# Patient Record
Sex: Female | Born: 1980 | Race: Black or African American | Hispanic: No | Marital: Single | Smoking: Former smoker
Health system: Southern US, Community
[De-identification: ages and names within clinical notes are randomized; demographics above are authoritative.]

## PROBLEM LIST (undated history)

## (undated) ENCOUNTER — Inpatient Hospital Stay (HOSPITAL_COMMUNITY): Payer: Self-pay

## (undated) ENCOUNTER — Emergency Department: Discharge status: Absent without leave | Payer: Medicaid Other

## (undated) DIAGNOSIS — I10 Essential (primary) hypertension: Secondary | ICD-10-CM

## (undated) DIAGNOSIS — B009 Herpesviral infection, unspecified: Secondary | ICD-10-CM

## (undated) HISTORY — DX: Herpesviral infection, unspecified: B00.9

---
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
## 1998-01-07 ENCOUNTER — Emergency Department (HOSPITAL_COMMUNITY): Admission: EM | Admit: 1998-01-07 | Discharge: 1998-01-08 | Payer: Self-pay | Admitting: Emergency Medicine

## 1998-04-19 ENCOUNTER — Encounter: Payer: Self-pay | Admitting: Emergency Medicine

## 1998-04-19 ENCOUNTER — Emergency Department (HOSPITAL_COMMUNITY): Admission: EM | Admit: 1998-04-19 | Discharge: 1998-04-19 | Payer: Self-pay | Admitting: Emergency Medicine

## 1998-09-22 ENCOUNTER — Encounter: Payer: Self-pay | Admitting: Emergency Medicine

## 1998-09-22 ENCOUNTER — Emergency Department (HOSPITAL_COMMUNITY): Admission: EM | Admit: 1998-09-22 | Discharge: 1998-09-22 | Payer: Self-pay | Admitting: *Deleted

## 1998-12-18 ENCOUNTER — Encounter: Payer: Self-pay | Admitting: Emergency Medicine

## 1998-12-18 ENCOUNTER — Emergency Department (HOSPITAL_COMMUNITY): Admission: EM | Admit: 1998-12-18 | Discharge: 1998-12-18 | Payer: Self-pay | Admitting: Emergency Medicine

## 1999-06-03 ENCOUNTER — Emergency Department (HOSPITAL_COMMUNITY): Admission: EM | Admit: 1999-06-03 | Discharge: 1999-06-03 | Payer: Self-pay | Admitting: Emergency Medicine

## 1999-09-24 ENCOUNTER — Ambulatory Visit (HOSPITAL_BASED_OUTPATIENT_CLINIC_OR_DEPARTMENT_OTHER): Admission: RE | Admit: 1999-09-24 | Discharge: 1999-09-24 | Payer: Self-pay | Admitting: Orthopedic Surgery

## 1999-09-24 ENCOUNTER — Encounter (INDEPENDENT_AMBULATORY_CARE_PROVIDER_SITE_OTHER): Payer: Self-pay | Admitting: *Deleted

## 1999-10-17 ENCOUNTER — Other Ambulatory Visit: Admission: RE | Admit: 1999-10-17 | Discharge: 1999-10-17 | Payer: Self-pay | Admitting: Obstetrics

## 2000-03-11 ENCOUNTER — Inpatient Hospital Stay (HOSPITAL_COMMUNITY): Admission: AD | Admit: 2000-03-11 | Discharge: 2000-03-11 | Payer: Self-pay | Admitting: Obstetrics

## 2000-03-29 ENCOUNTER — Inpatient Hospital Stay (HOSPITAL_COMMUNITY): Admission: AD | Admit: 2000-03-29 | Discharge: 2000-03-29 | Payer: Self-pay | Admitting: Obstetrics

## 2000-05-01 ENCOUNTER — Inpatient Hospital Stay (HOSPITAL_COMMUNITY): Admission: AD | Admit: 2000-05-01 | Discharge: 2000-05-01 | Payer: Self-pay | Admitting: Obstetrics

## 2000-05-29 ENCOUNTER — Inpatient Hospital Stay (HOSPITAL_COMMUNITY): Admission: AD | Admit: 2000-05-29 | Discharge: 2000-05-29 | Payer: Self-pay | Admitting: Internal Medicine

## 2000-05-29 ENCOUNTER — Inpatient Hospital Stay (HOSPITAL_COMMUNITY): Admission: AD | Admit: 2000-05-29 | Discharge: 2000-05-29 | Payer: Self-pay | Admitting: Obstetrics

## 2000-05-30 ENCOUNTER — Inpatient Hospital Stay (HOSPITAL_COMMUNITY): Admission: AD | Admit: 2000-05-30 | Discharge: 2000-06-01 | Payer: Self-pay | Admitting: Obstetrics

## 2000-09-29 ENCOUNTER — Emergency Department (HOSPITAL_COMMUNITY): Admission: EM | Admit: 2000-09-29 | Discharge: 2000-09-29 | Payer: Self-pay | Admitting: Emergency Medicine

## 2000-09-29 ENCOUNTER — Encounter: Payer: Self-pay | Admitting: Emergency Medicine

## 2001-01-28 ENCOUNTER — Emergency Department (HOSPITAL_COMMUNITY): Admission: EM | Admit: 2001-01-28 | Discharge: 2001-01-28 | Payer: Self-pay | Admitting: Emergency Medicine

## 2004-01-18 ENCOUNTER — Emergency Department (HOSPITAL_COMMUNITY): Admission: EM | Admit: 2004-01-18 | Discharge: 2004-01-18 | Payer: Self-pay

## 2005-01-23 ENCOUNTER — Emergency Department (HOSPITAL_COMMUNITY): Admission: EM | Admit: 2005-01-23 | Discharge: 2005-01-23 | Payer: Self-pay | Admitting: Emergency Medicine

## 2005-07-02 ENCOUNTER — Emergency Department (HOSPITAL_COMMUNITY): Admission: EM | Admit: 2005-07-02 | Discharge: 2005-07-02 | Payer: Self-pay | Admitting: Emergency Medicine

## 2007-01-08 ENCOUNTER — Emergency Department (HOSPITAL_COMMUNITY): Admission: EM | Admit: 2007-01-08 | Discharge: 2007-01-08 | Payer: Self-pay | Admitting: Emergency Medicine

## 2007-12-08 ENCOUNTER — Emergency Department (HOSPITAL_COMMUNITY): Admission: EM | Admit: 2007-12-08 | Discharge: 2007-12-08 | Payer: Self-pay | Admitting: Emergency Medicine

## 2008-10-02 ENCOUNTER — Emergency Department (HOSPITAL_COMMUNITY): Admission: EM | Admit: 2008-10-02 | Discharge: 2008-10-02 | Payer: Self-pay | Admitting: Emergency Medicine

## 2008-11-23 ENCOUNTER — Emergency Department (HOSPITAL_COMMUNITY): Admission: EM | Admit: 2008-11-23 | Discharge: 2008-11-24 | Payer: Self-pay | Admitting: Emergency Medicine

## 2009-08-18 ENCOUNTER — Emergency Department (HOSPITAL_COMMUNITY): Admission: EM | Admit: 2009-08-18 | Discharge: 2009-08-18 | Payer: Self-pay | Admitting: Emergency Medicine

## 2009-10-10 ENCOUNTER — Emergency Department (HOSPITAL_COMMUNITY): Admission: EM | Admit: 2009-10-10 | Discharge: 2009-10-10 | Payer: Self-pay | Admitting: Emergency Medicine

## 2010-04-11 ENCOUNTER — Inpatient Hospital Stay (HOSPITAL_COMMUNITY)
Admission: AD | Admit: 2010-04-11 | Discharge: 2010-04-11 | Payer: Self-pay | Source: Home / Self Care | Attending: Obstetrics & Gynecology | Admitting: Obstetrics & Gynecology

## 2010-04-11 LAB — CBC
HCT: 32.9 % — ABNORMAL LOW (ref 36.0–46.0)
Hemoglobin: 11.1 g/dL — ABNORMAL LOW (ref 12.0–15.0)
MCH: 29.8 pg (ref 26.0–34.0)
MCHC: 33.7 g/dL (ref 30.0–36.0)
MCV: 88.4 fL (ref 78.0–100.0)
Platelets: 234 10*3/uL (ref 150–400)
RBC: 3.72 MIL/uL — ABNORMAL LOW (ref 3.87–5.11)
RDW: 12.4 % (ref 11.5–15.5)
WBC: 14.1 10*3/uL — ABNORMAL HIGH (ref 4.0–10.5)

## 2010-04-11 LAB — URINALYSIS, ROUTINE W REFLEX MICROSCOPIC
Bilirubin Urine: NEGATIVE
Ketones, ur: NEGATIVE mg/dL
Nitrite: POSITIVE — AB
Protein, ur: 30 mg/dL — AB
Specific Gravity, Urine: 1.015 (ref 1.005–1.030)
Urine Glucose, Fasting: NEGATIVE mg/dL
Urobilinogen, UA: 1 mg/dL (ref 0.0–1.0)
pH: 6 (ref 5.0–8.0)

## 2010-04-11 LAB — WET PREP, GENITAL: Yeast Wet Prep HPF POC: NONE SEEN

## 2010-04-11 LAB — POCT PREGNANCY, URINE: Preg Test, Ur: NEGATIVE

## 2010-04-11 LAB — URINE MICROSCOPIC-ADD ON

## 2010-04-12 LAB — GC/CHLAMYDIA PROBE AMP, GENITAL
Chlamydia, DNA Probe: NEGATIVE
GC Probe Amp, Genital: NEGATIVE

## 2010-08-23 NOTE — Op Note (Signed)
[place]  Patient:    LENORA, GOMES                     MRN: [id number] Proc. Date: 09/24/99 Adm. Date:  [id number] Disc. Date: [id number] Attending:  Susa Day CC:         Katy Fitch. Sypher, Montez Hageman., M.D. 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  OPERATION PERFORMED:  Excision of right dorsal ganglion deep to extensor digititorum brevis manus muscle.  SURGEON:  Katy Fitch. Naaman Plummer., M.D.  ANESTHESIA:  High forearm level IV regional.  SUPERVISING ANESTHESIOLOGIST:  Dr. Krista Blue.  INDICATIONS FOR PROCEDURE:  Ms. Creedon is a 30 year old woman who presented for evaluation of mass on the dorsal aspect of her right wrist.  This was causing pain with wrist dorsiflexion and palmar flexion.  She had a history of prominent soft tissue masses on the dorsal aspect of both wrists.  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  However, on the right she also had a secondary mass consistent with a subretinacular ganglion.  After informed consent she was brought to the operating room at this time for excision of her ganglion.  DESCRIPTION OF PROCEDURE:  Shauntay Brunelli was brought to the operating room and placed in supine position on the operating table.  Following placement of a high forearm IV regional block, the right arm was prepped with Betadine soap and solution and sterilely draped.  When anesthesia was satisfactory, the procedure commenced with a short transverse incision.  The subcutaneous tissues were carefully divided taking care to identify the extensor retinaculum.  This was split to its distal fibers revealing a  large extensor digitorum brevis manus muscle.  This was retracted in an ulnar direction revealing a ganglion measuring 2 cm in width and 1.5 cm in length.  This was circumferentially dissected with scissors dissection and bipolar cautery to the capsule.  The stalk was removed directly from the scapholunate ligament.  A small opening in the dorsal capsule was made measuring 6 x 3 mm exposing the scapholunate ligament. The ligament appeared to be otherwise normal.  Bleeding points were electrocauterized with bipolar current followed by repair of the wound with intradermal 3-0 Prolene suture.  A compressive dressing was applied with a volar plaster splint maintaining the wrist in 5 degrees dorsiflexion. There were no apparent complications. DD:  09/24/99 TD:  09/26/99 Job: [id number]

## 2010-11-12 LAB — RUBELLA ANTIBODY, IGM: Rubella: IMMUNE

## 2010-11-12 LAB — ABO/RH: RH Type: POSITIVE

## 2010-11-12 LAB — HEPATITIS B SURFACE ANTIGEN: Hepatitis B Surface Ag: NEGATIVE

## 2010-11-12 LAB — GC/CHLAMYDIA PROBE AMP, GENITAL: Chlamydia: NEGATIVE

## 2010-11-12 LAB — RPR: RPR: NONREACTIVE

## 2010-12-07 HISTORY — PX: TOOTH EXTRACTION: SUR596

## 2010-12-19 ENCOUNTER — Inpatient Hospital Stay (HOSPITAL_COMMUNITY)
Admission: AD | Admit: 2010-12-19 | Discharge: 2010-12-19 | Disposition: A | Discharge status: Home / Self Care | Payer: Medicaid Other | Source: Ambulatory Visit | Attending: Obstetrics | Admitting: Obstetrics

## 2010-12-19 ENCOUNTER — Encounter (HOSPITAL_COMMUNITY): Payer: Self-pay | Admitting: *Deleted

## 2010-12-19 DIAGNOSIS — K029 Dental caries, unspecified: Secondary | ICD-10-CM

## 2010-12-19 DIAGNOSIS — K089 Disorder of teeth and supporting structures, unspecified: Secondary | ICD-10-CM | POA: Insufficient documentation

## 2010-12-19 DIAGNOSIS — O99891 Other specified diseases and conditions complicating pregnancy: Secondary | ICD-10-CM | POA: Insufficient documentation

## 2010-12-19 MED ORDER — AMOXICILLIN 250 MG PO CAPS: 500.0000 mg | ORAL_CAPSULE | Freq: Three times a day (TID) | ORAL | Status: AC

## 2010-12-19 MED ORDER — HYDROCODONE-ACETAMINOPHEN 5-325 MG PO TABS: 2.0000 | ORAL_TABLET | ORAL | Status: AC | PRN

## 2010-12-19 NOTE — Progress Notes (Signed)
Pt reports toothache today.  Pt G2 P1 at 15.1wks.

## 2010-12-19 NOTE — ED Provider Notes (Signed)
History     CSN: [id number] Arrival date & time: 12/19/2010  9:25 PM   Chief Complaint  Patient presents with  . Dental Pain     (Include location/radiation/quality/duration/timing/severity/associated sxs/prior treatment) HPIMonique O Kelley is a 30 y.o. female @ [redacted] weeks gestation who presents to MAU with a tooth ache. She was given tylenol #3 by Dr. Gaynell Face earlier today but it is not helping. Pain in upper right molar. The history was provided by the patient.  No past medical history on file.   No past surgical history on file.  No family history on file.  History  Substance Use Topics  . Smoking status: Not on file  . Smokeless tobacco: Not on file  . Alcohol Use: Not on file    OB History    Grav Para Term Preterm Abortions TAB SAB Ect Mult Living   1               Review of Systems  HENT: Positive for dental problem.   Genitourinary:       Pregnant.  All other systems reviewed and are negative.    Allergies  Review of patient's allergies indicates not on file.  Home Medications  No current outpatient prescriptions on file.  Physical Exam    There were no vitals taken for this visit.  Physical Exam  Nursing note and vitals reviewed. Constitutional: She is oriented to person, place, and time. She appears well-developed and well-nourished. No distress.  HENT:  Head: Normocephalic.  Mouth/Throat: Uvula is midline, oropharynx is clear and moist and mucous membranes are normal. Dental caries present.       Right upper second molar tender.  Eyes: EOM are normal.  Neck: Neck supple.  Pulmonary/Chest: Effort normal.  Abdominal:       Gravid at [redacted] weeks gestation.  Musculoskeletal: Normal range of motion.  Lymphadenopathy:    She has cervical adenopathy.  Neurological: She is alert and oriented to person, place, and time. No cranial nerve deficit.  Skin: Skin is warm and dry.    ED Course  Procedures  Results for orders placed during the hospital  encounter of 04/11/10  POCT PREGNANCY, URINE      Component Value Range   Preg Test, Ur       Value: NEGATIVE            THE SENSITIVITY OF THIS     METHODOLOGY IS >24 mIU/mL  URINALYSIS, ROUTINE W REFLEX MICROSCOPIC      Component Value Range   Color, Urine STRAW (*) YELLOW    Appearance HAZY (*) CLEAR    Specific Gravity, Urine 1.015  1.005 - 1.030    pH 6.0  5.0 - 8.0    Urine Glucose, Fasting NEGATIVE  NEGATIVE (mg/dL)   Hemoglobin, Urine SMALL (*) NEGATIVE    Bilirubin Urine NEGATIVE  NEGATIVE    Ketones, ur NEGATIVE  NEGATIVE (mg/dL)   Protein, ur 30 (*) NEGATIVE (mg/dL)   Urobilinogen, UA 1.0  0.0 - 1.0 (mg/dL)   Nitrite POSITIVE (*) NEGATIVE    Leukocytes, UA SMALL (*) NEGATIVE   URINE MICROSCOPIC-ADD ON      Component Value Range   Squamous Epithelial / LPF RARE  RARE    WBC, UA 11-20  <3 (WBC/hpf)   RBC / HPF 3-6  <3 (RBC/hpf)   Bacteria, UA FEW (*) RARE    Urine-Other TRICHOMONAS PRESENT    WET PREP, GENITAL      Component Value Range  Yeast, Wet Prep NONE SEEN  NONE SEEN    Trich, Wet Prep FEW (*) NONE SEEN    Clue Cells, Wet Prep FEW (*) NONE SEEN    WBC, Wet Prep HPF POC FEW MODERATE BACTERIA SEEN (*) NONE SEEN   CBC      Component Value Range   WBC 14.1 (*) 4.0 - 10.5 (K/uL)   RBC 3.72 (*) 3.87 - 5.11 (MIL/uL)   Hemoglobin 11.1 (*) 12.0 - 15.0 (g/dL)   HCT 04.5 (*) 40.9 - 46.0 (%)   MCV 88.4  78.0 - 100.0 (fL)   MCH 29.8  26.0 - 34.0 (pg)   MCHC 33.7  30.0 - 36.0 (g/dL)   RDW 81.1  91.4 - 78.2 (%)   Platelets 234  150 - 400 (K/uL)  GC/CHLAMYDIA PROBE AMP, GENITAL      Component Value Range   GC Probe Amp, Genital    NEGATIVE    Value: NEGATIVE     (NOTE)  Testing performed using the BD ProbeTec Qx Chlamydia trachomatis and Neisseria gonorrhea amplified DNA assay.  Performed at:  [facility]               [address]                    [place], [place] [address]               [id number]    Chlamydia, DNA Probe    NEGATIVE    Value: NEGATIVE     (NOTE)  Testing performed using the BD ProbeT[employer] Qx Chlamydia trachomatis and Neisseria gonorrhea amplified DNA assay.  Performed at:  [facility]               [address]                    [place], [place] [address]               [id number]   Assessment:   Dental Pain  Plan:   Amoxicillin 500 mg. Po tid x 7 days    Hydrocodone 5/325 mg. Po q 4 - 6 hours prn    Follow up with a dentist as soon as possible.   Wann, NP 12/19/10 2200

## 2011-01-28 ENCOUNTER — Other Ambulatory Visit (HOSPITAL_COMMUNITY): Payer: Self-pay | Admitting: Obstetrics

## 2011-01-28 DIAGNOSIS — Z3689 Encounter for other specified antenatal screening: Secondary | ICD-10-CM

## 2011-01-31 ENCOUNTER — Ambulatory Visit (HOSPITAL_COMMUNITY)
Admission: RE | Admit: 2011-01-31 | Discharge: 2011-01-31 | Disposition: A | Discharge status: Home / Self Care | Payer: Medicaid Other | Source: Ambulatory Visit | Attending: Obstetrics | Admitting: Obstetrics

## 2011-01-31 DIAGNOSIS — Z3689 Encounter for other specified antenatal screening: Secondary | ICD-10-CM

## 2011-01-31 DIAGNOSIS — Z1389 Encounter for screening for other disorder: Secondary | ICD-10-CM | POA: Insufficient documentation

## 2011-01-31 DIAGNOSIS — O358XX0 Maternal care for other (suspected) fetal abnormality and damage, not applicable or unspecified: Secondary | ICD-10-CM | POA: Insufficient documentation

## 2011-01-31 DIAGNOSIS — Z363 Encounter for antenatal screening for malformations: Secondary | ICD-10-CM | POA: Insufficient documentation

## 2011-02-24 ENCOUNTER — Encounter (HOSPITAL_COMMUNITY): Payer: Self-pay | Admitting: *Deleted

## 2011-02-24 ENCOUNTER — Inpatient Hospital Stay (HOSPITAL_COMMUNITY)
Admission: AD | Admit: 2011-02-24 | Discharge: 2011-02-24 | Disposition: A | Discharge status: Home / Self Care | Payer: Medicaid Other | Source: Ambulatory Visit | Attending: Obstetrics | Admitting: Obstetrics

## 2011-02-24 DIAGNOSIS — R109 Unspecified abdominal pain: Secondary | ICD-10-CM | POA: Insufficient documentation

## 2011-02-24 DIAGNOSIS — O99891 Other specified diseases and conditions complicating pregnancy: Secondary | ICD-10-CM | POA: Insufficient documentation

## 2011-02-24 DIAGNOSIS — N949 Unspecified condition associated with female genital organs and menstrual cycle: Secondary | ICD-10-CM

## 2011-02-24 LAB — URINALYSIS, ROUTINE W REFLEX MICROSCOPIC
Bilirubin Urine: NEGATIVE
Glucose, UA: NEGATIVE mg/dL
Hgb urine dipstick: NEGATIVE
Ketones, ur: NEGATIVE mg/dL
Leukocytes, UA: NEGATIVE
Nitrite: NEGATIVE
Protein, ur: NEGATIVE mg/dL
Specific Gravity, Urine: 1.02 (ref 1.005–1.030)
Urobilinogen, UA: 0.2 mg/dL (ref 0.0–1.0)
pH: 6.5 (ref 5.0–8.0)

## 2011-02-24 LAB — WET PREP, GENITAL
Trich, Wet Prep: NONE SEEN
Yeast Wet Prep HPF POC: NONE SEEN

## 2011-02-24 NOTE — Progress Notes (Signed)
Patient states she started having right lower abdominal and right back pain on Saturday. Reports good fetal movement, no bleeding or leaking.

## 2011-02-24 NOTE — ED Provider Notes (Signed)
History     Chief Complaint  Patient presents with  . Abdominal Pain   HPIMonique O Kelley is 30 y.o. G2P1001 [redacted]w[redacted]d weeks presenting with complaints of right sided pain.  Began 2 days ago.  Sharp worse at night, when lying flat.  Denies vaginal bleeding or discharge, or leaking of fluid. Last intercourse 4 days ago.      Past Medical History  Diagnosis Date  . No pertinent past medical history     Past Surgical History  Procedure Date  . No past surgeries   . Tooth extraction 12-2010    No family history on file.  History  Substance Use Topics  . Smoking status: Former Smoker    Quit date: 02/03/2011  . Smokeless tobacco: Never Used  . Alcohol Use: No    Allergies: No Known Allergies  Prescriptions prior to admission  Medication Sig Dispense Refill  . acetaminophen-codeine (TYLENOL #3) 300-30 MG per tablet Take 1 tablet by mouth 3 (three) times daily as needed. Pain        . calcium carbonate (TUMS - DOSED IN MG ELEMENTAL CALCIUM) 500 MG chewable tablet Chew 2 tablets by mouth 2 (two) times daily.        . prenatal vitamin w/FE, FA (PRENATAL 1 + 1) 27-1 MG TABS Take 1 tablet by mouth daily.          Review of Systems  Constitutional: Negative.   HENT: Negative.   Respiratory: Negative.   Cardiovascular: Negative.   Gastrointestinal: Positive for abdominal pain (bilateral lower, lateral-mild).  Genitourinary: Negative.        Negative for vaginal discharge or bleeding.  + fetal movement   Physical Exam   Blood pressure 125/79, pulse 78, temperature 97.8 F (36.6 C), temperature source Oral, resp. rate 16, height 5\' 7"  (1.702 m), weight 159 lb 12.8 oz (72.485 kg), SpO2 99.00%.  Physical Exam  Constitutional: She is oriented to person, place, and time. She appears well-developed and well-nourished. No distress.  HENT:  Head: Normocephalic.  Neck: Normal range of motion.  Cardiovascular: Normal rate.   Respiratory: Effort normal.  GI: Soft. She exhibits no  distension and no mass. There is tenderness (mild tenderness bilaterally on the lateral sides of her abdomen.  Neg for mid abdominal /uterine pain). There is no rebound and no guarding.  Genitourinary: Uterus is enlarged (appropriate for gestation). Uterus is not tender. Cervix exhibits no motion tenderness, no discharge and no friability. Right adnexum displays no tenderness. Left adnexum displays no tenderness. No erythema or bleeding around the vagina. Injury: small amount of frothy, non odorous discharge. Vaginal discharge found.  Neurological: She is alert and oriented to person, place, and time.  Skin: Skin is warm and dry.   Results for orders placed during the hospital encounter of 02/24/11 (from the past 24 hour(s))  URINALYSIS, ROUTINE W REFLEX MICROSCOPIC     Status: Normal   Collection Time   02/24/11  8:40 AM      Component Value Range   Color, Urine YELLOW  YELLOW    Appearance CLEAR  CLEAR    Specific Gravity, Urine 1.020  1.005 - 1.030    pH 6.5  5.0 - 8.0    Glucose, UA NEGATIVE  NEGATIVE (mg/dL)   Hgb urine dipstick NEGATIVE  NEGATIVE    Bilirubin Urine NEGATIVE  NEGATIVE    Ketones, ur NEGATIVE  NEGATIVE (mg/dL)   Protein, ur NEGATIVE  NEGATIVE (mg/dL)   Urobilinogen, UA 0.2  0.0 -  1.0 (mg/dL)   Nitrite NEGATIVE  NEGATIVE    Leukocytes, UA NEGATIVE  NEGATIVE   WET PREP, GENITAL     Status: Abnormal   Collection Time   02/24/11  9:44 AM      Component Value Range   Yeast, Wet Prep NONE SEEN  NONE SEEN    Trich, Wet Prep NONE SEEN  NONE SEEN    Clue Cells, Wet Prep FEW (*) NONE SEEN    WBC, Wet Prep HPF POC FEW (*) NONE SEEN    MAU Course  Procedures  MDM Reviewed lab results with the patient.  Encouraged her to keep appt with Dr. Gaynell Face scheduled for tomorrow.  Assessment and Plan  A: Round Ligament Pain at [redacted]w[redacted]d pregnant  P:  Reassured.  Keep appt for tomorrow with Dr. Gaynell Face.  [name],EVE M 02/24/2011, 9:36 AM   Matt Holmes, NP 02/24/11 1028

## 2011-04-08 NOTE — L&D Delivery Note (Signed)
Delivery Note At 10:38 AM a viable female was delivered via  (Presentation: ;  ).  APGAR: , ; weight .   Placenta status: , .  Cord:  with the following complications: .  Cord pH: not done  Anesthesia:   Episiotomy:  Lacerations:  Suture Repair: 2.0 Est. Blood Loss (mL):   Mom to postpartum.  Baby to nursery-stable.  Lori Kelley A 06/14/2011, 10:48 AM

## 2011-05-03 ENCOUNTER — Encounter (HOSPITAL_COMMUNITY): Payer: Self-pay | Admitting: *Deleted

## 2011-05-03 ENCOUNTER — Inpatient Hospital Stay (HOSPITAL_COMMUNITY)
Admission: AD | Admit: 2011-05-03 | Discharge: 2011-05-03 | Disposition: A | Discharge status: Home / Self Care | Payer: Medicaid Other | Source: Ambulatory Visit | Attending: Obstetrics | Admitting: Obstetrics

## 2011-05-03 DIAGNOSIS — O368190 Decreased fetal movements, unspecified trimester, not applicable or unspecified: Secondary | ICD-10-CM | POA: Insufficient documentation

## 2011-05-03 DIAGNOSIS — O4700 False labor before 37 completed weeks of gestation, unspecified trimester: Secondary | ICD-10-CM | POA: Insufficient documentation

## 2011-05-03 DIAGNOSIS — O479 False labor, unspecified: Secondary | ICD-10-CM

## 2011-05-03 MED ORDER — TERBUTALINE SULFATE 1 MG/ML IJ SOLN
0.2500 mg | Freq: Once | INTRAMUSCULAR | Status: AC
  Administered 2011-05-03: 0.25 mg via SUBCUTANEOUS
  Filled 2011-05-03: qty 1

## 2011-05-03 NOTE — Progress Notes (Signed)
Baby has not moved since yesterday morning, mornings are usually baby's active time, 33 weeks, G2P1

## 2011-05-03 NOTE — ED Provider Notes (Signed)
History     Chief Complaint  Patient presents with  . Decreased Fetal Movement   HPI This is a 31 y.o. who presents at [redacted]w[redacted]d with c/o decreased fetal movement. Denies leaking or bleeding. Does admit to feeling tightening for a month or so, but not painful.  Friends told her it was normal.  OB History    Grav Para Term Preterm Abortions TAB SAB Ect Mult Living   2 1 1  0 0 0 0 0 0 1      Past Medical History  Diagnosis Date  . No pertinent past medical history     Past Surgical History  Procedure Date  . No past surgeries   . Tooth extraction 12-2010    Family History  Problem Relation Age of Onset  . Anesthesia problems Neg Hx   . Hypotension Neg Hx   . Malignant hyperthermia Neg Hx   . Pseudochol deficiency Neg Hx     History  Substance Use Topics  . Smoking status: Former Smoker    Quit date: 02/03/2011  . Smokeless tobacco: Never Used  . Alcohol Use: No    Allergies: No Known Allergies  Prescriptions prior to admission  Medication Sig Dispense Refill  . acetaminophen-codeine (TYLENOL #3) 300-30 MG per tablet Take 1 tablet by mouth 3 (three) times daily as needed. Pain        . Prenatal Vit-Fe Fumarate-FA (PRENATAL MULTIVITAMIN) TABS Take 1 tablet by mouth daily.        ROS As above  Physical Exam   Blood pressure 127/83, pulse 77, temperature 98.7 F (37.1 C), temperature source Oral, resp. rate 16, height 5\' 6"  (1.676 m), weight 173 lb 3.2 oz (78.563 kg), SpO2 99.00%.  Physical Exam  Constitutional: She is oriented to person, place, and time. She appears well-developed and well-nourished.  HENT:  Head: Normocephalic.  Cardiovascular: Normal rate and regular rhythm.   Respiratory: Effort normal and breath sounds normal. No respiratory distress.  GI: Soft. She exhibits no distension and no mass. There is no tenderness. There is no rebound and no guarding.       Gravid, size equals dates.  EFM:  Reactive FHR, frequent mild contractions every 1-2  minutes.  Cervix Long and closed/ high  Genitourinary: Vagina normal and uterus normal. No vaginal discharge found.  Musculoskeletal: Normal range of motion.  Neurological: She is alert and oriented to person, place, and time.  Skin: Skin is warm and dry.  Psychiatric: She has a normal mood and affect.    MAU Course  Procedures   Assessment and Plan  A:  IUP at [redacted]w[redacted]d      Audible fetal movement with reassuring FHR tracing     Preterm contractions with no change in cervix P:  Terbutaline given [facility] 05/03/2011, 1:02 PM   UCs stopped after medication FFn not sent due to no change in cervix Will d/c home

## 2011-06-04 ENCOUNTER — Inpatient Hospital Stay (HOSPITAL_COMMUNITY)
Admission: AD | Admit: 2011-06-04 | Discharge: 2011-06-04 | Disposition: A | Discharge status: Home / Self Care | Payer: Medicaid Other | Source: Ambulatory Visit | Attending: Obstetrics | Admitting: Obstetrics

## 2011-06-04 ENCOUNTER — Encounter (HOSPITAL_COMMUNITY): Payer: Self-pay | Admitting: *Deleted

## 2011-06-04 DIAGNOSIS — O139 Gestational [pregnancy-induced] hypertension without significant proteinuria, unspecified trimester: Secondary | ICD-10-CM

## 2011-06-04 DIAGNOSIS — O99891 Other specified diseases and conditions complicating pregnancy: Secondary | ICD-10-CM | POA: Insufficient documentation

## 2011-06-04 DIAGNOSIS — O169 Unspecified maternal hypertension, unspecified trimester: Secondary | ICD-10-CM

## 2011-06-04 LAB — COMPREHENSIVE METABOLIC PANEL
ALT: 14 U/L (ref 0–35)
AST: 18 U/L (ref 0–37)
CO2: 22 mEq/L (ref 19–32)
Chloride: 103 mEq/L (ref 96–112)
Creatinine, Ser: 0.5 mg/dL (ref 0.50–1.10)
GFR calc Af Amer: >90 mL/min (ref >90)
GFR calc non Af Amer: >90 mL/min (ref >90)
Glucose, Bld: 81 mg/dL (ref 70–99)
Total Bilirubin: 0.1 mg/dL — ABNORMAL LOW (ref 0.3–1.2)

## 2011-06-04 LAB — CBC
HCT: 34 % — ABNORMAL LOW (ref 36.0–46.0)
Hemoglobin: 11.4 g/dL — ABNORMAL LOW (ref 12.0–15.0)
MCH: 30.2 pg (ref 26.0–34.0)
MCV: 89.9 fL (ref 78.0–100.0)
Platelets: 263 10*3/uL (ref 150–400)
RBC: 3.78 MIL/uL — ABNORMAL LOW (ref 3.87–5.11)
WBC: 15.1 10*3/uL — ABNORMAL HIGH (ref 4.0–10.5)

## 2011-06-04 LAB — LACTATE DEHYDROGENASE: LDH: 200 U/L (ref 94–250)

## 2011-06-04 NOTE — Progress Notes (Signed)
Pt states she felt first leakage at 1200 and then again at 1530

## 2011-06-05 ENCOUNTER — Encounter (HOSPITAL_COMMUNITY): Payer: Self-pay | Admitting: *Deleted

## 2011-06-05 ENCOUNTER — Encounter (HOSPITAL_COMMUNITY): Payer: Self-pay | Admitting: Advanced Practice Midwife

## 2011-06-05 ENCOUNTER — Telehealth (HOSPITAL_COMMUNITY): Payer: Self-pay | Admitting: *Deleted

## 2011-06-05 NOTE — Telephone Encounter (Signed)
Preadmission screen  

## 2011-06-05 NOTE — ED Provider Notes (Signed)
31 y.o. G2P1001 at [redacted]w[redacted]d here for labor check/ r/o SROM. Pt was cleared for discharge, but at that time BP was noted to be elevated. Dr. Gaynell Face ordered [facility] labs. I was requested to perform MSE only. Pt denies headache, vision changes, RUQ pain. + fetal movement.   O: Filed Vitals:   06/04/11 1726 06/04/11 1739 06/04/11 1825 06/04/11 1852  BP:  142/85 137/87 137/87  Pulse:  82 84 82  Temp:  98 F (36.7 C)    TempSrc:  Oral    Resp:  18    Height: 5\' 5"  (1.651 m)     Weight: 187 lb (84.823 kg)       A&O x 4 Abd: nontender Neuro: DTRs 1+, negative clonus EFM reactive  A/P: PIH - preeclampsia ruled out D/C home per Dr. Gaynell Face, has f/u in office tomorrow

## 2011-06-13 ENCOUNTER — Inpatient Hospital Stay (HOSPITAL_COMMUNITY): Payer: Medicaid Other | Admitting: Anesthesiology

## 2011-06-13 ENCOUNTER — Encounter (HOSPITAL_COMMUNITY): Payer: Self-pay | Admitting: Anesthesiology

## 2011-06-13 ENCOUNTER — Inpatient Hospital Stay (HOSPITAL_COMMUNITY)
Admission: RE | Admit: 2011-06-13 | Discharge: 2011-06-16 | DRG: 775 | Disposition: A | Discharge status: Home Health | Payer: Medicaid Other | Source: Ambulatory Visit | Attending: Obstetrics | Admitting: Obstetrics

## 2011-06-13 DIAGNOSIS — O480 Post-term pregnancy: Principal | ICD-10-CM | POA: Diagnosis present

## 2011-06-13 LAB — CBC
HCT: 33.8 % — ABNORMAL LOW (ref 36.0–46.0)
MCH: 29.9 pg (ref 26.0–34.0)
MCHC: 33.4 g/dL (ref 30.0–36.0)
MCV: 89.4 fL (ref 78.0–100.0)
RDW: 13.6 % (ref 11.5–15.5)

## 2011-06-13 MED ORDER — OXYTOCIN 20 UNITS IN LACTATED RINGERS INFUSION - SIMPLE
1.0000 m[IU]/min | INTRAVENOUS | Status: DC
  Administered 2011-06-13: 8 m[IU]/min via INTRAVENOUS
  Administered 2011-06-13: 1 m[IU]/min via INTRAVENOUS
  Administered 2011-06-13: 7 m[IU]/min via INTRAVENOUS
  Filled 2011-06-13: qty 1000

## 2011-06-13 MED ORDER — OXYCODONE-ACETAMINOPHEN 5-325 MG PO TABS: 1.0000 | ORAL_TABLET | ORAL | Status: DC | PRN

## 2011-06-13 MED ORDER — OXYTOCIN 20 UNITS IN LACTATED RINGERS INFUSION - SIMPLE: 125.0000 mL/h | Freq: Once | INTRAVENOUS | Status: DC

## 2011-06-13 MED ORDER — FENTANYL 2.5 MCG/ML BUPIVACAINE 1/10 % EPIDURAL INFUSION (WH - ANES)
14.0000 mL/h | INTRAMUSCULAR | Status: DC
  Administered 2011-06-13 – 2011-06-14 (×3): 14 mL/h via EPIDURAL
  Filled 2011-06-13 (×5): qty 60

## 2011-06-13 MED ORDER — LACTATED RINGERS IV SOLN: 500.0000 mL | INTRAVENOUS | Status: DC | PRN

## 2011-06-13 MED ORDER — ONDANSETRON HCL 4 MG/2ML IJ SOLN: 4.0000 mg | Freq: Four times a day (QID) | INTRAMUSCULAR | Status: DC | PRN

## 2011-06-13 MED ORDER — TERBUTALINE SULFATE 1 MG/ML IJ SOLN: 0.2500 mg | Freq: Once | INTRAMUSCULAR | Status: AC | PRN

## 2011-06-13 MED ORDER — SODIUM BICARBONATE 8.4 % IV SOLN
INTRAVENOUS | Status: DC | PRN
  Administered 2011-06-13: 4 mL via EPIDURAL

## 2011-06-13 MED ORDER — PHENYLEPHRINE 40 MCG/ML (10ML) SYRINGE FOR IV PUSH (FOR BLOOD PRESSURE SUPPORT): 80.0000 ug | PREFILLED_SYRINGE | INTRAVENOUS | Status: DC | PRN

## 2011-06-13 MED ORDER — LIDOCAINE HCL (PF) 1 % IJ SOLN: 30.0000 mL | INTRAMUSCULAR | Status: DC | PRN

## 2011-06-13 MED ORDER — LACTATED RINGERS IV SOLN
500.0000 mL | INTRAVENOUS | Status: DC | PRN
  Administered 2011-06-13: 1000 mL via INTRAVENOUS

## 2011-06-13 MED ORDER — CITRIC ACID-SODIUM CITRATE 334-500 MG/5ML PO SOLN: 30.0000 mL | ORAL | Status: DC | PRN

## 2011-06-13 MED ORDER — EPHEDRINE 5 MG/ML INJ: 10.0000 mg | INTRAVENOUS | Status: DC | PRN

## 2011-06-13 MED ORDER — FLEET ENEMA 7-19 GM/118ML RE ENEM: 1.0000 | ENEMA | RECTAL | Status: DC | PRN

## 2011-06-13 MED ORDER — IBUPROFEN 600 MG PO TABS: 600.0000 mg | ORAL_TABLET | Freq: Four times a day (QID) | ORAL | Status: DC | PRN

## 2011-06-13 MED ORDER — OXYCODONE-ACETAMINOPHEN 5-325 MG PO TABS
1.0000 | ORAL_TABLET | ORAL | Status: DC | PRN
  Administered 2011-06-14 (×2): 1 via ORAL
  Administered 2011-06-15: 2 via ORAL
  Administered 2011-06-15 (×2): 1 via ORAL
  Administered 2011-06-15: 2 via ORAL
  Administered 2011-06-15 – 2011-06-16 (×2): 1 via ORAL
  Filled 2011-06-13 (×6): qty 1
  Filled 2011-06-13: qty 2
  Filled 2011-06-13 (×2): qty 1

## 2011-06-13 MED ORDER — FENTANYL 2.5 MCG/ML BUPIVACAINE 1/10 % EPIDURAL INFUSION (WH - ANES)
INTRAMUSCULAR | Status: DC | PRN
  Administered 2011-06-13: 14 mL/h via EPIDURAL

## 2011-06-13 MED ORDER — LIDOCAINE HCL (PF) 1 % IJ SOLN
30.0000 mL | INTRAMUSCULAR | Status: DC | PRN
  Filled 2011-06-13: qty 30

## 2011-06-13 MED ORDER — OXYTOCIN BOLUS FROM INFUSION
500.0000 mL | Freq: Once | INTRAVENOUS | Status: DC
  Filled 2011-06-13: qty 500

## 2011-06-13 MED ORDER — EPHEDRINE 5 MG/ML INJ
10.0000 mg | INTRAVENOUS | Status: DC | PRN
  Filled 2011-06-13: qty 4

## 2011-06-13 MED ORDER — DIPHENHYDRAMINE HCL 50 MG/ML IJ SOLN
12.5000 mg | INTRAMUSCULAR | Status: DC | PRN
  Administered 2011-06-14: 12.5 mg via INTRAVENOUS
  Filled 2011-06-13: qty 1

## 2011-06-13 MED ORDER — ACETAMINOPHEN 325 MG PO TABS: 650.0000 mg | ORAL_TABLET | ORAL | Status: DC | PRN

## 2011-06-13 MED ORDER — PHENYLEPHRINE 40 MCG/ML (10ML) SYRINGE FOR IV PUSH (FOR BLOOD PRESSURE SUPPORT)
80.0000 ug | PREFILLED_SYRINGE | INTRAVENOUS | Status: DC | PRN
  Filled 2011-06-13: qty 5

## 2011-06-13 MED ORDER — LACTATED RINGERS IV SOLN
INTRAVENOUS | Status: DC
  Administered 2011-06-13 – 2011-06-14 (×2): via INTRAVENOUS

## 2011-06-13 MED ORDER — LACTATED RINGERS IV SOLN
INTRAVENOUS | Status: DC
  Administered 2011-06-13: 14:00:00 via INTRAVENOUS

## 2011-06-13 MED ORDER — LACTATED RINGERS IV SOLN: 500.0000 mL | Freq: Once | INTRAVENOUS | Status: DC

## 2011-06-13 NOTE — H&P (Signed)
This is Dr. Francoise Ceo dictating the history and physical on  Lori Kelley she's a 31 year old gravida 2 para 101 at 40 weeks and one day her due dates 06/12/2011 and she desired induction on admission her cervix was posterior fingertip vertex -2 station she was started on low-dose Pitocin for the present time having irregular contractions negative GBS Past medical history negative Past surgical history negative breasts Social history negative System review negative Physical exam Well-developed female in in in labor HEENT negative Breasts negative Heart regular rhythm no murmurs no gallops Abdomen term Pelvic as described above Extremities negative

## 2011-06-13 NOTE — Anesthesia Procedure Notes (Signed)

## 2011-06-13 NOTE — Anesthesia Preprocedure Evaluation (Signed)

## 2011-06-13 NOTE — Progress Notes (Signed)
MD calls to check on pt, report given.  Orders to stop pitocin, let pt eat and restart pitocin at 4am at 4mU.

## 2011-06-14 ENCOUNTER — Encounter (HOSPITAL_COMMUNITY): Payer: Self-pay

## 2011-06-14 MED ORDER — ZOLPIDEM TARTRATE 10 MG PO TABS: 10.0000 mg | ORAL_TABLET | Freq: Every evening | ORAL | Status: DC | PRN

## 2011-06-14 MED ORDER — TERBUTALINE SULFATE 1 MG/ML IJ SOLN: 0.2500 mg | Freq: Once | INTRAMUSCULAR | Status: DC | PRN

## 2011-06-14 MED ORDER — TETANUS-DIPHTH-ACELL PERTUSSIS 5-2.5-18.5 LF-MCG/0.5 IM SUSP
0.5000 mL | Freq: Once | INTRAMUSCULAR | Status: AC
  Administered 2011-06-15: 0.5 mL via INTRAMUSCULAR
  Filled 2011-06-14: qty 0.5

## 2011-06-14 MED ORDER — PRENATAL MULTIVITAMIN CH
1.0000 | ORAL_TABLET | Freq: Every day | ORAL | Status: DC
  Administered 2011-06-15: 1 via ORAL
  Filled 2011-06-14: qty 1

## 2011-06-14 MED ORDER — ZOLPIDEM TARTRATE 5 MG PO TABS: 5.0000 mg | ORAL_TABLET | Freq: Every evening | ORAL | Status: DC | PRN

## 2011-06-14 MED ORDER — BENZOCAINE-MENTHOL 20-0.5 % EX AERO: 1.0000 "application " | INHALATION_SPRAY | CUTANEOUS | Status: DC | PRN

## 2011-06-14 MED ORDER — BENZOCAINE-MENTHOL 20-0.5 % EX AERO
INHALATION_SPRAY | CUTANEOUS | Status: AC
  Filled 2011-06-14: qty 56

## 2011-06-14 MED ORDER — DIPHENHYDRAMINE HCL 25 MG PO CAPS: 25.0000 mg | ORAL_CAPSULE | Freq: Four times a day (QID) | ORAL | Status: DC | PRN

## 2011-06-14 MED ORDER — ONDANSETRON HCL 4 MG/2ML IJ SOLN: 4.0000 mg | INTRAMUSCULAR | Status: DC | PRN

## 2011-06-14 MED ORDER — LANOLIN HYDROUS EX OINT: TOPICAL_OINTMENT | CUTANEOUS | Status: DC | PRN

## 2011-06-14 MED ORDER — SENNOSIDES-DOCUSATE SODIUM 8.6-50 MG PO TABS
2.0000 | ORAL_TABLET | Freq: Every day | ORAL | Status: DC
  Administered 2011-06-14: 2 via ORAL

## 2011-06-14 MED ORDER — OXYTOCIN 20 UNITS IN LACTATED RINGERS INFUSION - SIMPLE: 2.0000 m[IU]/min | INTRAVENOUS | Status: DC

## 2011-06-14 MED ORDER — OXYTOCIN 20 UNITS IN LACTATED RINGERS INFUSION - SIMPLE
1.0000 m[IU]/min | INTRAVENOUS | Status: DC
  Administered 2011-06-14: 999 m[IU]/min via INTRAVENOUS

## 2011-06-14 MED ORDER — SIMETHICONE 80 MG PO CHEW: 80.0000 mg | CHEWABLE_TABLET | ORAL | Status: DC | PRN

## 2011-06-14 MED ORDER — WITCH HAZEL-GLYCERIN EX PADS: 1.0000 "application " | MEDICATED_PAD | CUTANEOUS | Status: DC | PRN

## 2011-06-14 MED ORDER — OXYCODONE-ACETAMINOPHEN 5-325 MG PO TABS: 1.0000 | ORAL_TABLET | ORAL | Status: DC | PRN

## 2011-06-14 MED ORDER — IBUPROFEN 600 MG PO TABS
600.0000 mg | ORAL_TABLET | Freq: Four times a day (QID) | ORAL | Status: DC
  Administered 2011-06-14 – 2011-06-16 (×7): 600 mg via ORAL
  Filled 2011-06-14 (×7): qty 1

## 2011-06-14 MED ORDER — ONDANSETRON HCL 4 MG PO TABS: 4.0000 mg | ORAL_TABLET | ORAL | Status: DC | PRN

## 2011-06-14 MED ORDER — DIBUCAINE 1 % RE OINT: 1.0000 "application " | TOPICAL_OINTMENT | RECTAL | Status: DC | PRN

## 2011-06-14 MED ORDER — FERROUS SULFATE 325 (65 FE) MG PO TABS
325.0000 mg | ORAL_TABLET | Freq: Two times a day (BID) | ORAL | Status: DC
  Administered 2011-06-14 – 2011-06-16 (×4): 325 mg via ORAL
  Filled 2011-06-14 (×4): qty 1

## 2011-06-14 NOTE — Progress Notes (Signed)
bp elevated--MD aware--continue to assess

## 2011-06-14 NOTE — Anesthesia Postprocedure Evaluation (Signed)
  Anesthesia Post-op Note  Patient: Lori Kelley  Procedure(s) Performed: * No procedures listed *  Patient Location: PACU and Mother/Baby  Anesthesia Type: Epidural  Level of Consciousness: awake, alert  and oriented  Airway and Oxygen Therapy: Patient Spontanous Breathing   Post-op Assessment: Patient's Cardiovascular Status Stable and Respiratory Function Stable  Post-op Vital Signs: stable  Complications: No apparent anesthesia complications

## 2011-06-15 LAB — CBC
Hemoglobin: 10.7 g/dL — ABNORMAL LOW (ref 12.0–15.0)
MCHC: 33.8 g/dL (ref 30.0–36.0)
Platelets: 231 10*3/uL (ref 150–400)
RBC: 3.54 MIL/uL — ABNORMAL LOW (ref 3.87–5.11)

## 2011-06-15 NOTE — Progress Notes (Signed)
Patient ID: Lori Kelley, female   DOB: 1980/06/22, 31 y.o.   MRN: [id number] Postpartum day one Vital signs normal Fundus firm Lochia moderate Legs negative No complaints

## 2011-06-16 ENCOUNTER — Inpatient Hospital Stay (HOSPITAL_COMMUNITY)
Admission: AD | Admit: 2011-06-16 | Discharge: 2011-06-16 | Disposition: A | Discharge status: Home / Self Care | Payer: Medicaid Other | Source: Ambulatory Visit | Attending: Obstetrics | Admitting: Obstetrics

## 2011-06-16 DIAGNOSIS — K5289 Other specified noninfective gastroenteritis and colitis: Secondary | ICD-10-CM

## 2011-06-16 DIAGNOSIS — K529 Noninfective gastroenteritis and colitis, unspecified: Secondary | ICD-10-CM

## 2011-06-16 DIAGNOSIS — O99893 Other specified diseases and conditions complicating puerperium: Secondary | ICD-10-CM | POA: Insufficient documentation

## 2011-06-16 DIAGNOSIS — B9789 Other viral agents as the cause of diseases classified elsewhere: Secondary | ICD-10-CM | POA: Insufficient documentation

## 2011-06-16 LAB — URINE MICROSCOPIC-ADD ON

## 2011-06-16 LAB — URINALYSIS, ROUTINE W REFLEX MICROSCOPIC
Glucose, UA: NEGATIVE mg/dL
Nitrite: NEGATIVE
Protein, ur: NEGATIVE mg/dL

## 2011-06-16 MED ORDER — ONDANSETRON HCL 4 MG/2ML IJ SOLN
4.0000 mg | Freq: Once | INTRAMUSCULAR | Status: AC
  Administered 2011-06-16: 4 mg via INTRAVENOUS
  Filled 2011-06-16: qty 2

## 2011-06-16 MED ORDER — MEDROXYPROGESTERONE ACETATE 150 MG/ML IM SUSP
150.0000 mg | Freq: Once | INTRAMUSCULAR | Status: AC
  Administered 2011-06-16: 150 mg via INTRAMUSCULAR
  Filled 2011-06-16: qty 1

## 2011-06-16 MED ORDER — DEXTROSE 5 % IN LACTATED RINGERS IV BOLUS
1000.0000 mL | Freq: Once | INTRAVENOUS | Status: AC
  Administered 2011-06-16: 1000 mL via INTRAVENOUS

## 2011-06-16 NOTE — Discharge Summary (Signed)
Obstetric Discharge Summary Reason for Admission: induction of labor Prenatal Procedures: none Intrapartum Procedures: spontaneous vaginal delivery Postpartum Procedures: none Complications-Operative and Postpartum: none Hemoglobin  Date Value Range Status  06/15/2011 10.7* 12.0-15.0 (g/dL) Final     HCT  Date Value Range Status  06/15/2011 31.7* 36.0-46.0 (%) Final    Discharge Diagnoses: Term Pregnancy-delivered  Discharge Information: Date: 06/16/2011 Activity: pelvic rest Diet: routine Medications: Percocet Condition: stable Instructions: refer to practice specific booklet Discharge to: home Follow-up Information    Follow up with Roland Prine A, MD. Call in 6 weeks.   Contact information:   [address] [place] [address] [phone]          Newborn Data: Live born female  Birth Weight: 6 lb 11.4 oz (3045 g) APGAR: 8, 9  Home with mother.  Gad Aymond A 06/16/2011, 6:41 AM

## 2011-06-16 NOTE — Discharge Instructions (Signed)
Discharge instructions   You can wash your hair  Shower  Eat what you want  Drink what you want  See me in 6 weeks  Your ankles are going to swell more in the next 2 weeks than when pregnant  No sex for 6 weeks   Kymari Lollis A, MD 06/16/2011

## 2011-06-16 NOTE — MAU Provider Note (Signed)
History     CSN: [id number]  Arrival date and time: 06/16/11 1349   First Provider Initiated Contact with Patient 06/16/11 1426      Chief Complaint  Patient presents with  . Emesis   HPI Lori Kelley is 31 y.o. [id number] discharged from hospital today after delivery.  One hour after being home, she has had 6 episodes of nausea and vomiting.  Diarrhea X 6-8 times.   Her Mother has had the same symptoms last week.     Past Medical History  Diagnosis Date  . No pertinent past medical history   . Herpes     Past Surgical History  Procedure Date  . No past surgeries   . Tooth extraction 12-2010    Family History  Problem Relation Age of Onset  . Anesthesia problems Neg Hx   . Hypotension Neg Hx   . Malignant hyperthermia Neg Hx   . Pseudochol deficiency Neg Hx   . Heart disease Mother   . Vision loss Father     History  Substance Use Topics  . Smoking status: Former Smoker    Quit date: 02/03/2011  . Smokeless tobacco: Never Used  . Alcohol Use: No    Allergies: No Known Allergies  Prescriptions prior to admission  Medication Sig Dispense Refill  . oxyCODONE-acetaminophen (PERCOCET) 10-325 MG per tablet Take 1 tablet by mouth every 4 (four) hours as needed.        ROS Physical Exam   Blood pressure 149/91, pulse 74, temperature 99.1 F (37.3 C), temperature source Oral, resp. rate 20, height 5\' 5"  (1.651 m), weight 176 lb (79.833 kg), unknown if currently breastfeeding.  Physical Exam     Results for orders placed during the hospital encounter of 06/16/11 (from the past 24 hour(s))  URINALYSIS, ROUTINE W REFLEX MICROSCOPIC     Status: Abnormal   Collection Time   06/16/11  2:35 PM      Component Value Range   Color, Urine YELLOW  YELLOW    APPearance CLEAR  CLEAR    Specific Gravity, Urine 1.020  1.005 - 1.030    pH 6.5  5.0 - 8.0    Glucose, UA NEGATIVE  NEGATIVE (mg/dL)   Hgb urine dipstick LARGE (*) NEGATIVE    Bilirubin Urine NEGATIVE   NEGATIVE    Ketones, ur NEGATIVE  NEGATIVE (mg/dL)   Protein, ur NEGATIVE  NEGATIVE (mg/dL)   Urobilinogen, UA 0.2  0.0 - 1.0 (mg/dL)   Nitrite NEGATIVE  NEGATIVE    Leukocytes, UA SMALL (*) NEGATIVE   URINE MICROSCOPIC-ADD ON     Status: Abnormal   Collection Time   06/16/11  2:35 PM      Component Value Range   Squamous Epithelial / LPF FEW (*) RARE    WBC, UA 3-6  <3 (WBC/hpf)   RBC / HPF 7-10  <3 (RBC/hpf)   Bacteria, UA FEW (*) RARE    MAU Course  Procedures  MDM 15:20 Reported MSE and lab results with Dr. Gaynell Face.  Order to give 1 liter of D5LR with Zofran 4mg .  Discharge to home with Gatorade and advance diet tomorrow.  DO not give med for diarrhea.   16:40  Patient states she is feeling much better.  Will discharge after the liter has infused.  Assessment and Plan  A:  Post-partum viral illness- gastroenteritis  P;  Instructed to only drink po fluids-gatorade today and advance diet as tolerated tomorrow.  Keep schedule post partum appointment  with Dr. Gaynell Face.  Report any worsening sxs to him.        Lori Kelley,EVE M 06/16/2011, 2:27 PM

## 2011-06-16 NOTE — MAU Note (Signed)
Post partum, left hopsital today, one hour home and starting vomiting and diarrhea . Drank coffee before leaving hospital, abd pain started with N/V/D Did not call her MD Gaynell Face)  Multiple episodes of N/V/D since home today

## 2011-06-16 NOTE — Discharge Instructions (Signed)
Diet for Diarrhea, Adult Having frequent, runny stools (diarrhea) has many causes. Diarrhea may be caused or worsened by food or drink. Diarrhea may be relieved by changing your diet. IF YOU ARE NOT TOLERATING SOLID FOODS:  Drink enough water and fluids to keep your urine clear or pale yellow.   Avoid sugary drinks and sodas as well as milk-based beverages.   Avoid beverages containing caffeine and alcohol.   You may try rehydrating beverages. You can make your own by following this recipe:    tsp table salt.    tsp baking soda.   ? tsp salt substitute (potassium chloride).   1 tbs + 1 tsp sugar.   1 qt water.  As your stools become more solid, you can start eating solid foods. Add foods one at a time. If a certain food causes your diarrhea to get worse, avoid that food and try other foods. A low fiber, low-fat, and lactose-free diet is recommended. Small, frequent meals may be better tolerated.  Starches  Allowed:  White, French, and pita breads, plain rolls, buns, bagels. Plain muffins, matzo. Soda, saltine, or graham crackers. Pretzels, melba toast, zwieback. Cooked cereals made with water: cornmeal, farina, cream cereals. Dry cereals: refined corn, wheat, rice. Potatoes prepared any way without skins, refined macaroni, spaghetti, noodles, refined rice.   Avoid:  Bread, rolls, or crackers made with whole wheat, multi-grains, rye, bran seeds, nuts, or coconut. Corn tortillas or taco shells. Cereals containing whole grains, multi-grains, bran, coconut, nuts, or raisins. Cooked or dry oatmeal. Coarse wheat cereals, granola. Cereals advertised as "high-fiber." Potato skins. Whole grain pasta, wild or brown rice. Popcorn. Sweet potatoes/yams. Sweet rolls, doughnuts, waffles, pancakes, sweet breads.  Vegetables  Allowed: Strained tomato and vegetable juices. Most well-cooked and canned vegetables without seeds. Fresh: Tender lettuce, cucumber without the skin, cabbage, spinach, bean  sprouts.   Avoid: Fresh, cooked, or canned: Artichokes, baked beans, beet greens, broccoli, Brussels sprouts, corn, kale, legumes, peas, sweet potatoes. Cooked: Green or red cabbage, spinach. Avoid large servings of any vegetables, because vegetables shrink when cooked, and they contain more fiber per serving than fresh vegetables.  Fruit  Allowed: All fruit juices except prune juice. Cooked or canned: Apricots, applesauce, cantaloupe, cherries, fruit cocktail, grapefruit, grapes, kiwi, mandarin oranges, peaches, pears, plums, watermelon. Fresh: Apples without skin, ripe banana, grapes, cantaloupe, cherries, grapefruit, peaches, oranges, plums. Keep servings limited to  cup or 1 piece.   Avoid: Fresh: Apple with skin, apricots, mango, pears, raspberries, strawberries. Prune juice, stewed or dried prunes. Dried fruits, raisins, dates. Large servings of all fresh fruits.  Meat and Meat Substitutes  Allowed: Ground or well-cooked tender beef, ham, veal, lamb, pork, or poultry. Eggs, plain cheese. Fish, oysters, shrimp, lobster, other seafoods. Liver, organ meats.   Avoid: Tough, fibrous meats with gristle. Peanut butter, smooth or chunky. Cheese, nuts, seeds, legumes, dried peas, beans, lentils.  Milk  Allowed: Yogurt, lactose-free milk, kefir, drinkable yogurt, buttermilk, soy milk.   Avoid: Milk, chocolate milk, beverages made with milk, such as milk shakes.  Soups  Allowed: Bouillon, broth, or soups made from allowed foods. Any strained soup.   Avoid: Soups made from vegetables that are not allowed, cream or milk-based soups.  Desserts and Sweets  Allowed: Sugar-free gelatin, sugar-free frozen ice pops made without sugar alcohol.   Avoid: Plain cakes and cookies, pie made with allowed fruit, pudding, custard, cream pie. Gelatin, fruit, ice, sherbet, frozen ice pops. Ice cream, ice milk without nuts. Plain hard candy,   honey, jelly, molasses, syrup, sugar, chocolate syrup, gumdrops,  marshmallows.  Fats and Oils  Allowed: Avoid any fats and oils.   Avoid: Seeds, nuts, olives, avocados. Margarine, butter, cream, mayonnaise, salad oils, plain salad dressings made from allowed foods. Plain gravy, crisp bacon without rind.  Beverages  Allowed: Water, decaffeinated teas, oral rehydration solutions, sugar-free beverages.   Avoid: Fruit juices, caffeinated beverages (coffee, tea, soda or pop), alcohol, sports drinks, or lemon-lime soda or pop.  Condiments  Allowed: Ketchup, mustard, horseradish, vinegar, cream sauce, cheese sauce, cocoa powder. Spices in moderation: allspice, basil, bay leaves, celery powder or leaves, cinnamon, cumin powder, curry powder, ginger, mace, marjoram, onion or garlic powder, oregano, paprika, parsley flakes, ground pepper, rosemary, sage, savory, tarragon, thyme, turmeric.   Avoid: Coconut, honey.  Weight Monitoring: Weigh yourself every day. You should weigh yourself in the morning after you urinate and before you eat breakfast. Wear the same amount of clothing when you weigh yourself. Record your weight daily. Bring your recorded weights to your clinic visits. Tell your caregiver right away if you have gained 3 lb/1.4 kg or more in 1 day, 5 lb/2.3 kg in a week, or whatever amount you were told to report. SEEK IMMEDIATE MEDICAL CARE IF:   You are unable to keep fluids down.   You start to throw up (vomit) or diarrhea keeps coming back (persistent).   Abdominal pain develops, increases, or can be felt in one place (localizes).   You have an oral temperature above 102 F (38.9 C), not controlled by medicine.   Diarrhea contains blood or mucus.   You develop excessive weakness, dizziness, fainting, or extreme thirst.  MAKE SURE YOU:   Understand these instructions.   Will watch your condition.   Will get help right away if you are not doing well or get worse.  Document Released: 06/14/2003 Document Revised: 03/13/2011 Document Reviewed:  10/05/2008 [facility] Patient Information 2012 [place], [place].Viral Infections A virus is a type of germ. Viruses can cause:  Minor sore throats.   Aches and pains.   Headaches.   Runny nose.   Rashes.   Watery eyes.   Tiredness.   Coughs.   Loss of appetite.   Feeling sick to your stomach (nausea).   Throwing up (vomiting).   Watery poop (diarrhea).  HOME CARE   Only take medicines as told by your doctor.   Drink enough water and fluids to keep your pee (urine) clear or pale yellow. Sports drinks are a good choice.   Get plenty of rest and eat healthy. Soups and broths with crackers or rice are fine.  GET HELP RIGHT AWAY IF:   You have a very bad headache.   You have shortness of breath.   You have chest pain or neck pain.   You have an unusual rash.   You cannot stop throwing up.   You have watery poop that does not stop.   You cannot keep fluids down.   You or your child has a temperature by mouth above 102 F (38.9 C), not controlled by medicine.   Your baby is older than 3 months with a rectal temperature of 102 F (38.9 C) or higher.   Your baby is 61 months old or younger with a rectal temperature of 100.4 F (38 C) or higher.  MAKE SURE YOU:   Understand these instructions.   Will watch this condition.   Will get help right away if you are not doing well or get  worse.  Document Released: 03/06/2008 Document Revised: 03/13/2011 Document Reviewed: 07/30/2010 [facility] Patient Information 2012 [place], [place].  Stay well hydrated with small amounts of gatorade today.  Dr. Gaynell Face would like for you to advance you diet as tolerated.

## 2011-06-17 NOTE — Progress Notes (Signed)
Post discharge chart review completed.  

## 2011-08-21 ENCOUNTER — Emergency Department (HOSPITAL_COMMUNITY)
Admission: EM | Admit: 2011-08-21 | Discharge: 2011-08-22 | Disposition: A | Discharge status: Home / Self Care | Payer: Medicaid Other | Attending: Emergency Medicine | Admitting: Emergency Medicine

## 2011-08-21 ENCOUNTER — Encounter (HOSPITAL_COMMUNITY): Payer: Self-pay

## 2011-08-21 ENCOUNTER — Emergency Department (HOSPITAL_COMMUNITY): Payer: Medicaid Other

## 2011-08-21 DIAGNOSIS — B009 Herpesviral infection, unspecified: Secondary | ICD-10-CM | POA: Insufficient documentation

## 2011-08-21 DIAGNOSIS — R55 Syncope and collapse: Secondary | ICD-10-CM | POA: Insufficient documentation

## 2011-08-21 DIAGNOSIS — R404 Transient alteration of awareness: Secondary | ICD-10-CM | POA: Insufficient documentation

## 2011-08-21 DIAGNOSIS — Z87891 Personal history of nicotine dependence: Secondary | ICD-10-CM | POA: Insufficient documentation

## 2011-08-21 DIAGNOSIS — I10 Essential (primary) hypertension: Secondary | ICD-10-CM | POA: Insufficient documentation

## 2011-08-21 DIAGNOSIS — R42 Dizziness and giddiness: Secondary | ICD-10-CM | POA: Insufficient documentation

## 2011-08-21 DIAGNOSIS — R51 Headache: Secondary | ICD-10-CM

## 2011-08-21 HISTORY — DX: Essential (primary) hypertension: I10

## 2011-08-21 LAB — POCT PREGNANCY, URINE: Preg Test, Ur: NEGATIVE

## 2011-08-21 MED ORDER — ACETAMINOPHEN 325 MG PO TABS
650.0000 mg | ORAL_TABLET | Freq: Once | ORAL | Status: AC
  Administered 2011-08-21: 650 mg via ORAL
  Filled 2011-08-21: qty 2

## 2011-08-21 NOTE — ED Provider Notes (Signed)
History     CSN: [id number]  Arrival date & time 08/21/11  2110   First MD Initiated Contact with Patient 08/21/11 2135      Chief Complaint  Patient presents with  . Loss of Consciousness    Pt had HA and dizzy and had syncopal episode (pt was eased to ground by friend, unknown length of LOC.)  Pt had delayed reactions to naming people (fiancee).  Pt had CBG 114, EKG, and per EMS pt was coming more around.  Pt has 33 week old and returned to work two days ago.  Pt also complaining of arm pain and HA and left shoulder pain.      (Consider location/radiation/quality/duration/timing/severity/associated sxs/prior treatment) HPI Comments: Stood up after eating and felt lightheaded.  LOC then returned to normal function.  No chest pain, dyspnea, palpitations.  Otherwise doing well.  No current symptoms.  Pt has had a gradual onset headache for the last few days but says this is a typical headache for her.  Patient is a 31 y.o. female presenting with syncope. The history is provided by the patient.  Loss of Consciousness This is a new problem. The current episode started today. The problem occurs rarely. The problem has been resolved. Associated symptoms include headaches (mild HA present for last few dzays similar to headaches that pt has had throughout her preganncy which ended 8 weeks ago.). Pertinent negatives include no abdominal pain, chest pain, chills, congestion, diaphoresis, fever, nausea, numbness, rash, vertigo, visual change or vomiting.    Past Medical History  Diagnosis Date  . No pertinent past medical history   . Herpes   . Hypertension     Past Surgical History  Procedure Date  . No past surgeries   . Tooth extraction 12-2010    Family History  Problem Relation Age of Onset  . Anesthesia problems Neg Hx   . Hypotension Neg Hx   . Malignant hyperthermia Neg Hx   . Pseudochol deficiency Neg Hx   . Heart disease Mother   . Vision loss Father     History    Substance Use Topics  . Smoking status: Former Smoker    Quit date: 02/03/2011  . Smokeless tobacco: Never Used  . Alcohol Use: No    OB History    Grav Para Term Preterm Abortions TAB SAB Ect Mult Living   2 2 2  0 0 0 0 0 0 2      Review of Systems  Constitutional: Negative for fever, chills, diaphoresis and activity change.  HENT: Negative for congestion.   Eyes: Negative for visual disturbance.  Respiratory: Negative for chest tightness and shortness of breath.   Cardiovascular: Positive for syncope. Negative for chest pain and leg swelling.  Gastrointestinal: Negative for nausea, vomiting and abdominal pain.  Genitourinary: Negative for dysuria.  Skin: Negative for rash.  Neurological: Positive for headaches (mild HA present for last few dzays similar to headaches that pt has had throughout her preganncy which ended 8 weeks ago.). Negative for vertigo, syncope and numbness.  Psychiatric/Behavioral: Negative for behavioral problems.    Allergies  Review of patient's allergies indicates no known allergies.  Home Medications   Current Outpatient Rx  Name Route Sig Dispense Refill  . ACETAMINOPHEN-CODEINE #3 300-30 MG PO TABS Oral Take 1 tablet by mouth every 4 (four) hours as needed. For pain.      BP 151/102  Pulse 73  Temp(Src) 98.8 F (37.1 C) (Oral)  Resp 18  SpO2  100%  Physical Exam  Constitutional: She is oriented to person, place, and time. She appears well-developed and well-nourished.  HENT:  Head: Normocephalic and atraumatic.  Eyes: Conjunctivae and EOM are normal. Pupils are equal, round, and reactive to light. No scleral icterus.  Neck: Normal range of motion. Neck supple.  Cardiovascular: Normal rate and regular rhythm.  Exam reveals no gallop and no friction rub.   No murmur heard. Pulmonary/Chest: Effort normal and breath sounds normal. No respiratory distress. She has no wheezes. She has no rales. She exhibits no tenderness.  Abdominal: Soft.  She exhibits no distension and no mass. There is no tenderness. There is no rebound and no guarding.  Musculoskeletal: Normal range of motion.  Neurological: She is alert and oriented to person, place, and time. She has normal reflexes. No cranial nerve deficit.       5/5 strength in all extremities.  Skin: Skin is warm and dry. No rash noted.  Psychiatric: She has a normal mood and affect. Her behavior is normal. Judgment and thought content normal.    ED Course  Procedures (including critical care time)  EKG: NSR, no ST/T wave abnormality.    Labs Reviewed  POCT PREGNANCY, URINE  GLUCOSE, CAPILLARY   Ct Head Wo Contrast  08/21/2011  *RADIOLOGY REPORT*  Clinical Data: Headache and syncope.  Loss of consciousness.  CT HEAD WITHOUT CONTRAST  Technique:  Contiguous axial images were obtained from the base of the skull through the vertex without contrast.  Comparison: None.  Findings: The ventricles and sulci appear symmetrical.  No mass effect or midline shift.  No ventricular dilatation.  No abnormal extra-axial fluid collections.  Gray-white matter junctions are distinct.  Basal cisterns are not effaced.  No evidence of acute intracranial hemorrhage.  Visualized mastoid air cells and paranasal sinuses are not opacified.  No depressed skull fractures.  IMPRESSION: No acute intracranial abnormalities.  Original Report Authenticated By: Marlon Pel, M.D.     1. Syncope   2. Headache       MDM  Stood up after eating and felt lightheaded.  LOC then returned to normal function.  No chest pain, dyspnea, palpitations.  Otherwise doing well.  No current symptoms.  Pt has had a gradual onset headache for the last few days but says this is a typical headache for her.  Head CT unconcerning.  Normal neuro exam.  Presentation c/w orthostatic syncope. No symptoms in ED.  No evidence of cardiogenic source of syncope.  Labs unconcerning.  PCP f/u.  Pt comfortable with plan and will follow  up.        Army Chaco, MD 08/21/11 [phone]

## 2011-08-21 NOTE — ED Provider Notes (Signed)
   ECG shows normal sinus rhythm with a rate of 72, no ectopy. Normal axis. Normal P wave. Normal QRS. Normal intervals. Normal ST and T waves. Impression: normal ECG. No old ECG available for comparison.   Dione Booze, MD 08/21/11 [phone]

## 2011-08-21 NOTE — ED Provider Notes (Addendum)
31 year old female had an episode of dizziness and then passed out. Loss of consciousness fairly brief. She woke up promptly. She has been having a bifrontal headache for the last 2 days which he describes as sharp and throbbing. She also states that she had complications of her recent pregnancy which included hypertension and headaches. She delivered 2 months ago. She feels she is back to her baseline and her husband also states that she is back to baseline. On exam, fundi are normal. There is no disc carotid bruit. Lungs are clear, heart regular rate and rhythm without murmur. Neurologic exam is normal. That she probably had a simple orthostatic syncope but given the proximity to delivery CT scan of the head will be obtained to rule out Sheehan syndrome.   Dione Booze, MD 08/21/11 [id number]  Dione Booze, MD 08/21/11 [phone]

## 2011-08-21 NOTE — ED Provider Notes (Signed)
I saw and evaluated the patient, reviewed the resident's note and I agree with the findings and plan.   Dione Booze, MD 08/21/11 [phone]

## 2011-08-21 NOTE — ED Notes (Signed)
Pt transported to CT ?

## 2011-08-21 NOTE — Discharge Instructions (Signed)
Headache Headaches are caused by many different problems. Most commonly, headache is caused by muscle tension from an injury, fatigue, or emotional upset. Excessive muscle contractions in the scalp and neck result in a headache that often feels like a tight band around the head. Tension headaches often have areas of tenderness over the scalp and the back of the neck. These headaches may last for hours, days, or longer, and some may contribute to migraines in those who have migraine problems. Migraines usually cause a throbbing headache, which is made worse by activity. Sometimes only one side of the head hurts. Nausea, vomiting, eye pain, and avoidance of food are common with migraines. Visual symptoms such as light sensitivity, blind spots, or flashing lights may also occur. Loud noises may worsen migraine headaches. Many factors may cause migraine headaches:  Emotional stress, lack of sleep, and menstrual periods.   Alcohol and some drugs (such as birth control pills).   Diet factors (fasting, caffeine, food preservatives, chocolate).   Environmental factors (weather changes, bright lights, odors, smoke).  Other causes of headaches include minor injuries to the head. Arthritis in the neck; problems with the jaw, eyes, ears, or nose are also causes of headaches. Allergies, drugs, alcohol, and exposure to smoke can also cause moderate headaches. Rebound headaches can occur if someone uses pain medications for a long period of time and then stops. Less commonly, blood vessel problems in the neck and brain (including stroke) can cause various types of headache. Treatment of headaches includes medicines for pain and relaxation. Ice packs or heat applied to the back of the head and neck help some people. Massaging the shoulders, neck and scalp are often very useful. Relaxation techniques and stretching can help prevent these headaches. Avoid alcohol and cigarette smoking as these tend to make headaches  worse. Please see your caregiver if your headache is not better in 2 days.  SEEK IMMEDIATE MEDICAL CARE IF:   You develop a high fever, chills, or repeated vomiting.   You faint or have difficulty with vision.   You develop unusual numbness or weakness of your arms or legs.   Relief of pain is inadequate with medication, or you develop severe pain.   You develop confusion, or neck stiffness.   You have a worsening of a headache or do not obtain relief.  Document Released: 03/24/2005 Document Revised: 03/13/2011 Document Reviewed: 09/17/2006 [facility] Patient Information 2012 [place], [place].Syncope You have had a fainting (syncopal) spell. A fainting episode is a sudden, short-lived loss of consciousness. It results in complete recovery. It occurs because there has been a temporary shortage of oxygen and/or sugar (glucose) to the brain. CAUSES   Blood pressure pills and other medications that may lower blood pressure below normal. Sudden changes in posture (sudden standing).   Over-medication. Take your medications as directed.   Standing too long. This can cause blood to pool in the legs.   Seizure disorders.   Low blood sugar (hypoglycemia) of diabetes. This more commonly causes coma.   Bearing down to go to the bathroom. This can cause your blood pressure to rise suddenly. Your body compensates by making the blood pressure too low when you stop bearing down.   Hardening of the arteries where the brain temporarily does not receive enough blood.   Irregular heart beat and circulatory problems.   Fear, emotional distress, injury, sight of blood, or illness.  Your caregiver will send you home if the syncope was from non-worrisome causes (benign). Depending on  your age and health, you may stay to be monitored and observed. If you return home, have someone stay with you if your caregiver feels that is desirable. It is very important to keep all follow-up referrals and appointments in  order to properly manage this condition. This is a serious problem which can lead to serious illness and death if not carefully managed.  WARNING: Do not drive or operate machinery until your caregiver feels that it is safe for you to do so. SEEK IMMEDIATE MEDICAL CARE IF:   You have another fainting episode or faint while lying or sitting down. DO NOT DRIVE YOURSELF. Call 911 if no other help is available.   You have chest pain, are feeling sick to your stomach (nausea), vomiting or abdominal pain.   You have an irregular heartbeat or one that is very fast (pulse over 120 beats per minute).   You have a loss of feeling in some part of your body or lose movement in your arms or legs.   You have difficulty with speech, confusion, severe weakness, or visual problems.   You become sweaty and/or feel light headed.  Make sure you are rechecked as instructed. Document Released: 03/24/2005 Document Revised: 03/13/2011 Document Reviewed: 11/12/2006 [facility] Patient Information 2012 [place], [place].

## 2011-09-28 ENCOUNTER — Inpatient Hospital Stay (HOSPITAL_COMMUNITY)
Admission: AD | Admit: 2011-09-28 | Discharge: 2011-09-28 | Disposition: A | Discharge status: Home / Self Care | Payer: Medicaid Other | Source: Ambulatory Visit | Attending: Obstetrics | Admitting: Obstetrics

## 2011-09-28 ENCOUNTER — Encounter (HOSPITAL_COMMUNITY): Payer: Self-pay

## 2011-09-28 DIAGNOSIS — L02419 Cutaneous abscess of limb, unspecified: Secondary | ICD-10-CM | POA: Insufficient documentation

## 2011-09-28 DIAGNOSIS — L03119 Cellulitis of unspecified part of limb: Secondary | ICD-10-CM

## 2011-09-28 MED ORDER — OXYCODONE-ACETAMINOPHEN 5-325 MG PO TABS
1.0000 | ORAL_TABLET | Freq: Four times a day (QID) | ORAL | Status: DC | PRN
  Administered 2011-09-28: 2 via ORAL
  Filled 2011-09-28: qty 2

## 2011-09-28 MED ORDER — OXYCODONE-ACETAMINOPHEN 5-325 MG PO TABS: 1.0000 | ORAL_TABLET | ORAL | Status: AC | PRN

## 2011-09-28 MED ORDER — IBUPROFEN 600 MG PO TABS
600.0000 mg | ORAL_TABLET | Freq: Four times a day (QID) | ORAL | Status: DC | PRN
  Administered 2011-09-28: 600 mg via ORAL
  Filled 2011-09-28: qty 1

## 2011-09-28 MED ORDER — IBUPROFEN 600 MG PO TABS: 600.0000 mg | ORAL_TABLET | Freq: Four times a day (QID) | ORAL | Status: AC | PRN

## 2011-09-28 MED ORDER — SULFAMETHOXAZOLE-TRIMETHOPRIM 800-160 MG PO TABS: 1.0000 | ORAL_TABLET | Freq: Two times a day (BID) | ORAL | Status: AC

## 2011-09-28 MED ORDER — OXYCODONE-ACETAMINOPHEN 5-325 MG PO TABS: 1.0000 | ORAL_TABLET | Freq: Four times a day (QID) | ORAL | Status: DC | PRN

## 2011-09-28 NOTE — MAU Note (Signed)
Pt states uses clippers to shave her upper legs/pubic area, has large raised area on her right upper thigh. Has used warm washcloth that did help pain. Denies any other issues with vaginal d/c changes or bleeding.

## 2011-09-28 NOTE — Discharge Instructions (Signed)
Cellulitis Cellulitis is an infection of the skin and the tissue beneath it. The area is typically red and tender. It is caused by germs (bacteria) (usually staph or strep) that enter the body through cuts or sores. Cellulitis most commonly occurs in the arms or lower legs.  HOME CARE INSTRUCTIONS   If you are given a prescription for medications which kill germs (antibiotics), take as directed until finished.   If the infection is on the arm or leg, keep the limb elevated as able.   Use a warm cloth several times per day to relieve pain and encourage healing.   See your caregiver for recheck of the infected site as directed if problems arise.   Only take over-the-counter or prescription medicines for pain, discomfort, or fever as directed by your caregiver.  SEEK MEDICAL CARE IF:   The area of redness (inflammation) is spreading, there are red streaks coming from the infected site, or if a part of the infection begins to turn dark in color.   The joint or bone underneath the infected skin becomes painful after the skin has healed.   The infection returns in the same or another area after it seems to have gone away.   A boil or bump swells up. This may be an abscess.   New, unexplained problems such as pain or fever develop.  SEEK IMMEDIATE MEDICAL CARE IF:   You have a fever.   You or your child feels drowsy or lethargic.   There is vomiting, diarrhea, or lasting discomfort or feeling ill (malaise) with muscle aches and pains.  MAKE SURE YOU:  UnderstandAbscess An abscess (boil or furuncle) is an infected area that contains a collection of pus.  SYMPTOMS Signs and symptoms of an abscess include pain, tenderness, redness, or hardness. You may feel a moveable soft area under your skin. An abscess can occur anywhere in the body.  TREATMENT  A surgical cut (incision) may be made over your abscess to drain the pus. Gauze may be packed into the space or a drain may be looped through  the abscess cavity (pocket). This provides a drain that will allow the cavity to heal from the inside outwards. The abscess may be painful for a few days, but should feel much better if it was drained.  Your abscess, if seen early, may not have localized and may not have been drained. If not, another appointment may be required if it does not get better on its own or with medications. HOME CARE INSTRUCTIONS   Only take over-the-counter or prescription medicines for pain, discomfort, or fever as directed by your caregiver.   Take your antibiotics as directed if they were prescribed. Finish them even if you start to feel better.   Keep the skin and clothes clean around your abscess.   If the abscess was drained, you will need to use gauze dressing to collect any draining pus. Dressings will typically need to be changed 3 or more times a day.   The infection may spread by skin contact with others. Avoid skin contact as much as possible.   Practice good hygiene. This includes regular hand washing, cover any draining skin lesions, and do not share personal care items.   If you participate in sports, do not share athletic equipment, towels, whirlpools, or personal care items. Shower after every practice or tournament.   If a draining area cannot be adequately covered:   Do not participate in sports.   Children should not  participate in day care until the wound has healed or drainage stops.   If your caregiver has given you a follow-up appointment, it is very important to keep that appointment. Not keeping the appointment could result in a much worse infection, chronic or permanent injury, pain, and disability. If there is any problem keeping the appointment, you must call back to this facility for assistance.  SEEK MEDICAL CARE IF:   You develop increased pain, swelling, redness, drainage, or bleeding in the wound site.   You develop signs of generalized infection including muscle aches,  chills, fever, or a general ill feeling.   You have an oral temperature above 102 F (38.9 C).  MAKE SURE YOU:   Understand these instructions.   Will watch your condition.   Will get help right away if you are not doing well or get worse.  Document Released: 01/01/2005 Document Revised: 03/13/2011 Document Reviewed: 10/26/2007  [facility] Patient Information 2012 [place], [place]. these instructions.   Will watch your condition.   Will get help right away if you are not doing well or get worse.  Document Released: 01/01/2005 Document Revised: 03/13/2011 Document Reviewed: 11/10/2007 [facility] Patient Information 2012 [place], [place].

## 2011-09-28 NOTE — MAU Provider Note (Signed)
Montez Morita y.o.[id number] @Unknown  by LMP Chief Complaint  Patient presents with  . Cyst     Provider contact at 1735   SUBJECTIVE  HPI: Presents with two-day history of a lump on her right inner thigh in the area where she's shaved. The lump has gotten bigger and more painful. Also has tenderness and) region. She's taken ibuprofen 200 mg 3-4 times without relief. No drainage. No fever. Denies prior episodes of having furuncles or abscesses.   Past Medical History  Diagnosis Date  . Herpes   . Hypertension    Past Surgical History  Procedure Date  . Tooth extraction 12-2010   History   Social History  . Marital Status: Single    Spouse Name: N/A    Number of Children: N/A  . Years of Education: N/A   Occupational History  . Not on file.   Social History Main Topics  . Smoking status: Former Smoker    Quit date: 02/03/2011  . Smokeless tobacco: Never Used  . Alcohol Use: No  . Drug Use: No  . Sexually Active: Yes    Birth Control/ Protection: None, Injection   Other Topics Concern  . Not on file   Social History Narrative  . No narrative on file   No current facility-administered medications on file prior to encounter.   No current outpatient prescriptions on file prior to encounter.   No Known Allergies  ROS: Pertinent items in HPI  OBJECTIVE Blood pressure 129/87, pulse 92, temperature 99.2 F (37.3 C), temperature source Oral, resp. rate 18, height 5\' 6"  (1.676 m), weight 63.231 kg (139 lb 6.4 oz), not currently breastfeeding.  GENERAL: Well-developed, well-nourished female in no apparent discomfort HEENT: Normocephalic, good dentition HEART: normal rate RESP: normal effort ABDOMEN: Soft, nontender except rt inguinal area tender with shotty lymphadenopathy EXTREMITIES: Right inner thigh with 5cm area of induration with circumscribed redness, no fluctuance or drainage NEURO: Alert and oriented   MAU Course:  Percocet 1 po given with some  relief.   ASSESSMENT  1. Cellulitis and abscess of leg     PLAN C/W Dr. Clearance Coots Bactrim and ibuprofen Rx given. Rx Percocet  #15 for breakthrough pain. Hot soaks to area.  F/U with Dr. In the office in 2 days, sooner if fever/chills.      Akyia Borelli 09/28/2011 5:53 PM

## 2011-09-28 NOTE — MAU Note (Signed)
Pt reports having a boil/abcesson inside upper right thigh that started yesterday

## 2012-03-30 ENCOUNTER — Emergency Department (HOSPITAL_COMMUNITY): Payer: Medicaid Other

## 2012-03-30 ENCOUNTER — Encounter (HOSPITAL_COMMUNITY): Payer: Self-pay | Admitting: Nurse Practitioner

## 2012-03-30 ENCOUNTER — Emergency Department (HOSPITAL_COMMUNITY)
Admission: EM | Admit: 2012-03-30 | Discharge: 2012-03-30 | Disposition: A | Discharge status: Home / Self Care | Payer: Medicaid Other | Attending: Emergency Medicine | Admitting: Emergency Medicine

## 2012-03-30 ENCOUNTER — Other Ambulatory Visit: Payer: Self-pay

## 2012-03-30 DIAGNOSIS — Z8619 Personal history of other infectious and parasitic diseases: Secondary | ICD-10-CM | POA: Insufficient documentation

## 2012-03-30 DIAGNOSIS — Z87891 Personal history of nicotine dependence: Secondary | ICD-10-CM | POA: Insufficient documentation

## 2012-03-30 DIAGNOSIS — R0602 Shortness of breath: Secondary | ICD-10-CM | POA: Insufficient documentation

## 2012-03-30 DIAGNOSIS — I10 Essential (primary) hypertension: Secondary | ICD-10-CM | POA: Insufficient documentation

## 2012-03-30 DIAGNOSIS — R0789 Other chest pain: Secondary | ICD-10-CM | POA: Insufficient documentation

## 2012-03-30 DIAGNOSIS — R079 Chest pain, unspecified: Secondary | ICD-10-CM

## 2012-03-30 DIAGNOSIS — K219 Gastro-esophageal reflux disease without esophagitis: Secondary | ICD-10-CM | POA: Insufficient documentation

## 2012-03-30 LAB — CBC
MCH: 30 pg (ref 26.0–34.0)
MCV: 88.1 fL (ref 78.0–100.0)
Platelets: 281 10*3/uL (ref 150–400)
RDW: 12.5 % (ref 11.5–15.5)

## 2012-03-30 LAB — BASIC METABOLIC PANEL
Calcium: 9.2 mg/dL (ref 8.4–10.5)
Creatinine, Ser: 0.82 mg/dL (ref 0.50–1.10)
GFR calc Af Amer: >90 mL/min (ref >90)
GFR calc non Af Amer: >90 mL/min (ref >90)

## 2012-03-30 LAB — POCT I-STAT TROPONIN I: Troponin i, poc: 0 ng/mL (ref 0.00–0.08)

## 2012-03-30 MED ORDER — FAMOTIDINE 20 MG PO TABS: 20.0000 mg | ORAL_TABLET | Freq: Two times a day (BID) | ORAL | Status: DC

## 2012-03-30 MED ORDER — GI COCKTAIL ~~LOC~~
30.0000 mL | Freq: Once | ORAL | Status: AC
  Administered 2012-03-30: 30 mL via ORAL
  Filled 2012-03-30: qty 30

## 2012-03-30 NOTE — ED Notes (Signed)
Per ems: pt c/o CP for past 2 days with movement and bending only, started after moving heavy furniture, no pain en route. No cardiac history, VSS, A&Ox4

## 2012-03-30 NOTE — ED Provider Notes (Signed)
History     CSN: [id number]  Arrival date & time 03/30/12  1004   First MD Initiated Contact with Patient 03/30/12 1007      Chief Complaint  Patient presents with  . Chest Pain    (Consider location/radiation/quality/duration/timing/severity/associated sxs/prior treatment) HPI Comments:  31 year old female with a history of hypertension managed by monitoring presents to the emergency department complaining of chest pain for the past 2 days. States the pain is worse with movement. The pain started while she was at home doing housework. When the pain comes on she describes it as sharp, rated 5/10 and nonradiating. Admits to associated shortness of breath, however she states is from smoking. EMS gave her aspirin which helped relieve the little pain she was having. Currently she is only in mild pain. Patient has a history of acid reflux when she was a teenager. On Sunday she ate tacos for dinner. Denies headache, lightheadedness, dizziness, diaphoresis, nausea or vomiting. Mom has a history of heart disease diagnosed after the age of 92.  The history is provided by the patient.    Past Medical History  Diagnosis Date  . Herpes   . Hypertension     Past Surgical History  Procedure Date  . Tooth extraction 12-2010    Family History  Problem Relation Age of Onset  . Anesthesia problems Neg Hx   . Hypotension Neg Hx   . Malignant hyperthermia Neg Hx   . Pseudochol deficiency Neg Hx   . Other Neg Hx   . Heart disease Mother   . Vision loss Father     History  Substance Use Topics  . Smoking status: Former Smoker    Quit date: 02/03/2011  . Smokeless tobacco: Never Used  . Alcohol Use: No    OB History    Grav Para Term Preterm Abortions TAB SAB Ect Mult Living   2 2 2  0 0 0 0 0 0 2      Review of Systems  Constitutional: Negative for diaphoresis.  HENT: Negative.   Eyes: Negative for visual disturbance.  Respiratory: Positive for shortness of breath. Negative for  cough.   Cardiovascular: Positive for chest pain. Negative for palpitations and leg swelling.  Gastrointestinal: Negative for nausea and vomiting.  Genitourinary: Negative.   Musculoskeletal: Negative for back pain.  Skin: Negative.   Neurological: Negative for dizziness, light-headedness and headaches.  Psychiatric/Behavioral: The patient is not nervous/anxious.     Allergies  Review of patient's allergies indicates no known allergies.  Home Medications  No current outpatient prescriptions on file.  BP 135/89  Pulse 90  Temp 97.9 F (36.6 C) (Oral)  Resp 20  SpO2 100%  Breastfeeding? Unknown  Physical Exam  Nursing note and vitals reviewed. Constitutional: She is oriented to person, place, and time. She appears well-developed and well-nourished. No distress.  HENT:  Head: Normocephalic and atraumatic.  Mouth/Throat: Oropharynx is clear and moist.  Eyes: Conjunctivae normal and EOM are normal. Pupils are equal, round, and reactive to light.  Neck: Normal range of motion. Neck supple.  Cardiovascular: Normal rate, regular rhythm, normal heart sounds and intact distal pulses.        No extremity edema.  Pulmonary/Chest: Effort normal and breath sounds normal. No respiratory distress. She has no wheezes. She has no rales.  Abdominal: Soft. Bowel sounds are normal. There is no tenderness.  Musculoskeletal: Normal range of motion. She exhibits no edema.  Neurological: She is alert and oriented to person, place, and time.  Skin: Skin is warm and dry. She is not diaphoretic.  Psychiatric: She has a normal mood and affect. Her behavior is normal.    ED Course  Procedures (including critical care time)   Labs Reviewed  CBC  BASIC METABOLIC PANEL  POCT I-STAT TROPONIN I   Dg Chest 2 View  03/30/2012  *RADIOLOGY REPORT*  Clinical Data: Chest pain  CHEST - 2 VIEW  Comparison: None.  Findings: Lungs clear.  Heart size and pulmonary vascularity are normal.  No adenopathy.  No  pneumothorax.  No bone lesions.  IMPRESSION: No abnormality noted.   Original Report Authenticated By: Bretta Bang, M.D.      Date: 03/30/2012  Rate: 91  Rhythm: normal sinus rhythm  QRS Axis: normal  Intervals: normal  ST/T Wave abnormalities: normal  Conduction Disutrbances:none  Narrative Interpretation: NSR, early precordial r/s transition  Old EKG Reviewed: unchanged   1. Acid reflux   2. Chest pain       MDM  31 y/o female with chest pain. Pain improved with GI cocktail. She ate a large taco meal Sunday. Has hx of acid reflux. EKG, CXR, labs unremarkable. No concern for cardiac or pulmonary origin. Rx pepcid. She will f/u with her PCP. Return precautions discussed. Patient states her understanding of plan and is agreeable.       Trevor Mace, PA-C 03/30/12 1140

## 2012-03-30 NOTE — ED Notes (Signed)
Patient transported to X-ray 

## 2012-03-30 NOTE — ED Provider Notes (Signed)
Medical screening examination/treatment/procedure(s) were performed by non-physician practitioner and as supervising physician I was immediately available for consultation/collaboration.  Shelda Jakes, MD 03/30/12 [phone]

## 2012-04-10 ENCOUNTER — Encounter (HOSPITAL_COMMUNITY): Payer: Self-pay | Admitting: Obstetrics and Gynecology

## 2012-04-10 ENCOUNTER — Inpatient Hospital Stay (HOSPITAL_COMMUNITY)
Admission: AD | Admit: 2012-04-10 | Discharge: 2012-04-10 | Disposition: A | Discharge status: Home / Self Care | Payer: Medicaid Other | Source: Ambulatory Visit | Attending: Obstetrics & Gynecology | Admitting: Obstetrics & Gynecology

## 2012-04-10 DIAGNOSIS — K529 Noninfective gastroenteritis and colitis, unspecified: Secondary | ICD-10-CM

## 2012-04-10 DIAGNOSIS — R197 Diarrhea, unspecified: Secondary | ICD-10-CM | POA: Insufficient documentation

## 2012-04-10 DIAGNOSIS — A088 Other specified intestinal infections: Secondary | ICD-10-CM | POA: Insufficient documentation

## 2012-04-10 DIAGNOSIS — K5289 Other specified noninfective gastroenteritis and colitis: Secondary | ICD-10-CM

## 2012-04-10 DIAGNOSIS — R109 Unspecified abdominal pain: Secondary | ICD-10-CM | POA: Insufficient documentation

## 2012-04-10 DIAGNOSIS — R112 Nausea with vomiting, unspecified: Secondary | ICD-10-CM | POA: Insufficient documentation

## 2012-04-10 LAB — URINE MICROSCOPIC-ADD ON

## 2012-04-10 LAB — URINALYSIS, ROUTINE W REFLEX MICROSCOPIC
Bilirubin Urine: NEGATIVE
Ketones, ur: NEGATIVE mg/dL
Protein, ur: NEGATIVE mg/dL
Urobilinogen, UA: 0.2 mg/dL (ref 0.0–1.0)

## 2012-04-10 MED ORDER — ONDANSETRON HCL 4 MG PO TABS
8.0000 mg | ORAL_TABLET | Freq: Once | ORAL | Status: AC
  Administered 2012-04-10: 8 mg via ORAL
  Filled 2012-04-10: qty 2

## 2012-04-10 MED ORDER — PROMETHAZINE HCL 25 MG PO TABS: 25.0000 mg | ORAL_TABLET | Freq: Four times a day (QID) | ORAL | Status: DC | PRN

## 2012-04-10 NOTE — MAU Note (Signed)
"  I've been throwing up since 0400 this morning.  I have diarrhea too.  I was on the toilet at least 8-9 tiimes.    I tried to take Pepto Bismol, gatorade and gingerale.  All of it came back up.  I can't keep anything down.  I ate at [facility] last night.  Now I have a pain in my stomach that won't go away.  There was a girl at my job that was sick recently.  I have been this sick when I was d/c'd after having my daughter here last year.  I ended up catching a virus.  I don't feel bad.  It's just my stomach is hurting."

## 2012-04-10 NOTE — MAU Provider Note (Signed)
  History     CSN: [id number]  Arrival date and time: 04/10/12 1202   First Provider Initiated Contact with Patient 04/10/12 1337      Chief Complaint  Patient presents with  . Emesis  . Diarrhea   HPI This is a 32 y.o. female who presents with c/o nausea and vomiting since last night. Also has had abdominal cramping and diarrhea. Since diarrhea started, nausea has improved.  Denies fever.  OB History    Grav Para Term Preterm Abortions TAB SAB Ect Mult Living   2 2 2  0 0 0 0 0 0 2      Past Medical History  Diagnosis Date  . Herpes   . Hypertension     Past Surgical History  Procedure Date  . Tooth extraction 12-2010    Family History  Problem Relation Age of Onset  . Anesthesia problems Neg Hx   . Hypotension Neg Hx   . Malignant hyperthermia Neg Hx   . Pseudochol deficiency Neg Hx   . Other Neg Hx   . Heart disease Mother   . Vision loss Father     History  Substance Use Topics  . Smoking status: Former Smoker    Quit date: 02/03/2011  . Smokeless tobacco: Never Used  . Alcohol Use: No    Allergies: No Known Allergies  Prescriptions prior to admission  Medication Sig Dispense Refill  . famotidine (PEPCID) 20 MG tablet Take 1 tablet (20 mg total) by mouth 2 (two) times daily.  30 tablet  0  . ibuprofen (ADVIL,MOTRIN) 200 MG tablet Take 200 mg by mouth every 6 (six) hours as needed. For pain        Review of Systems  Constitutional: Positive for malaise/fatigue. Negative for fever.  Eyes: Negative for blurred vision.  Respiratory: Negative for cough.   Gastrointestinal: Positive for nausea, vomiting and abdominal pain.  Musculoskeletal: Negative for myalgias.  Neurological: Negative for dizziness and headaches.  Psychiatric/Behavioral: Negative for depression.     Physical Exam   Blood pressure 124/85, pulse 96, temperature 98.2 F (36.8 C), temperature source Oral, resp. rate 18, height 5\' 6"  (1.676 m), weight 164 lb (74.39 kg).  Physical  Exam  Constitutional: She is oriented to person, place, and time. She appears well-developed and well-nourished. No distress.  Cardiovascular: Normal rate.   Respiratory: Effort normal.  GI: Soft. She exhibits no distension and no mass. There is tenderness (diffuse, very mild). There is no rebound and no guarding.  Musculoskeletal: Normal range of motion.  Neurological: She is alert and oriented to person, place, and time.  Skin: Skin is warm and dry.  Psychiatric: She has a normal mood and affect.  No gynecologic complaints  MAU Course  Procedures  MDM Since nausea has improved, will try dose of zofran and PO fluid challenge. If keeps fluids down, will discharge home to PO hydrate. >>  Tolerated fluids well after zofran. Wants phenergan for home use due to history of constipation.  Assessment and Plan  A:  Likely viral gastroenteritis      Able to tolerate PO fluids now  P:  Discharge home      Rx Phenergan for PRN home use      Follow up with family doctor prn or other EDs if worsens  [facility] 04/10/2012, 1:46 PM

## 2012-04-10 NOTE — MAU Note (Signed)
Pt reports having N/V/D and stiomach cramping since 4am. Can't keep anything down. Tried taking some peptobismol but that came up as well. Ate at [facility] last night.

## 2013-02-02 IMAGING — US US OB DETAIL+14 WK
2 series · 12 of 28 positions shown · non-contrast
Comparison: none

[Series 1: us ob detail +14 wk · 57 acquisitions, 11 frames shown (1 of 2)]
[im 3/57]
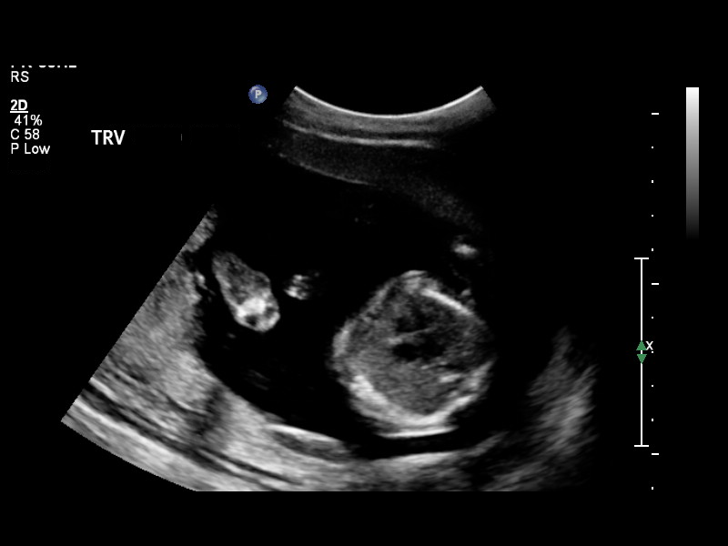
[im 8/57]
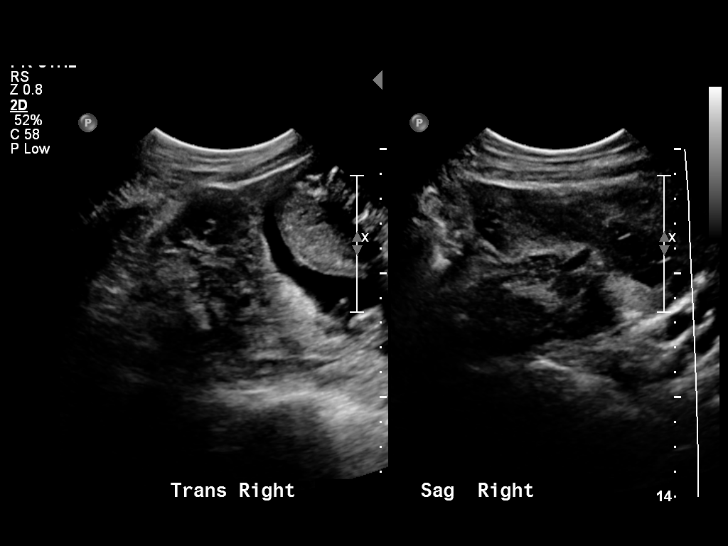
[im 12/57]
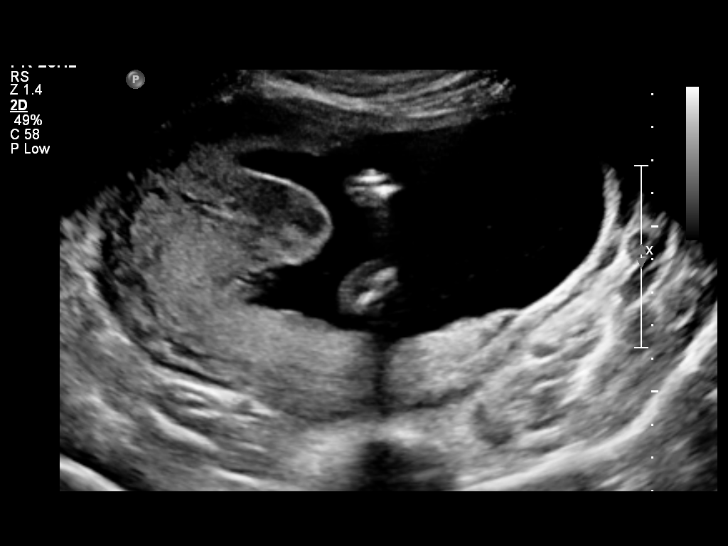
[im 19/57]
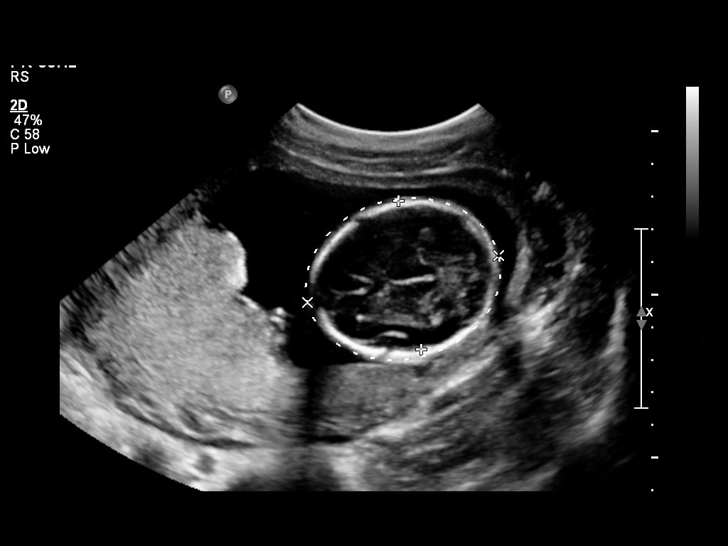
[im 24/57]
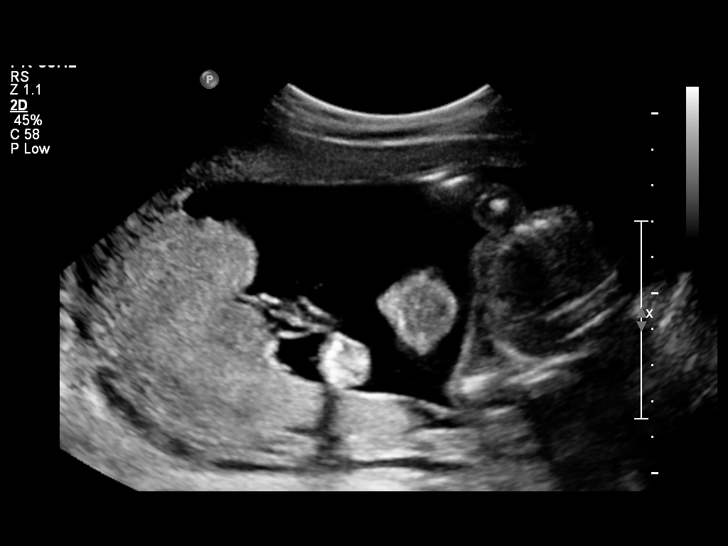
[im 29/57]
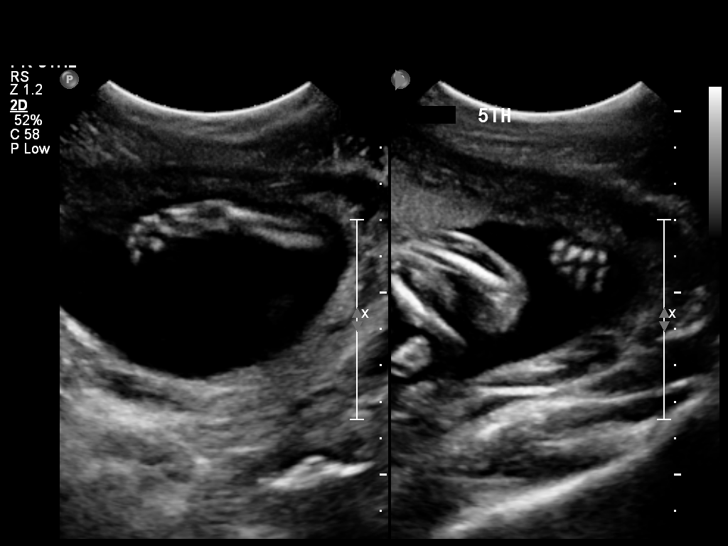
[im 36/57]
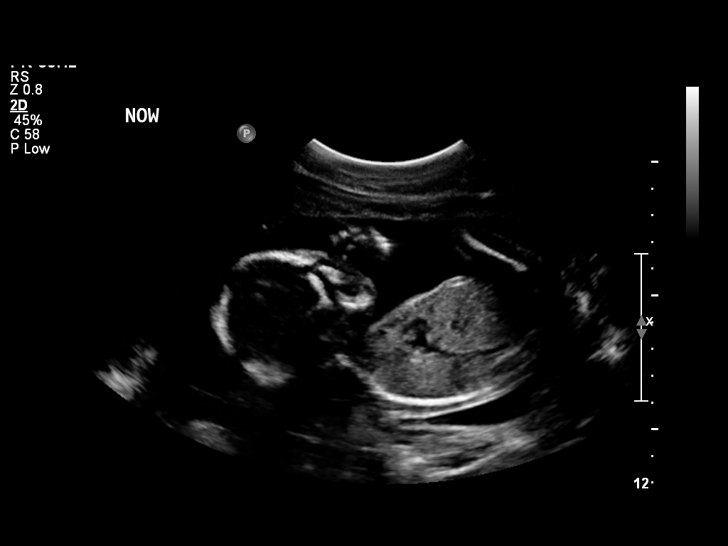
[im 40/57]
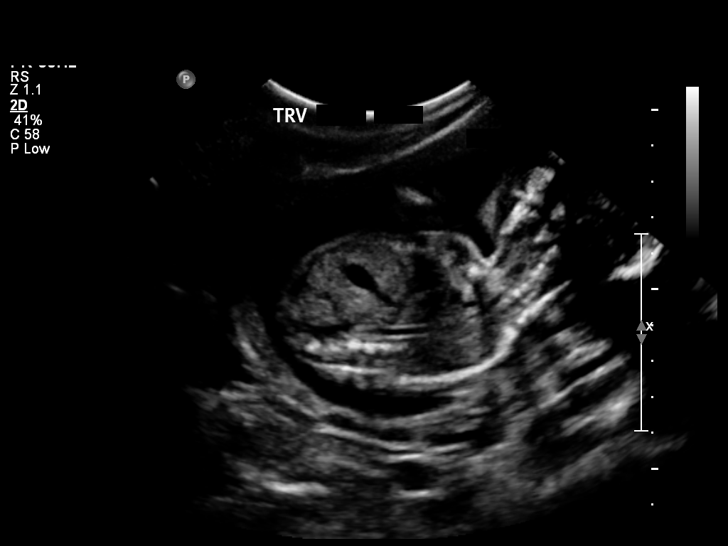
[im 45/57]
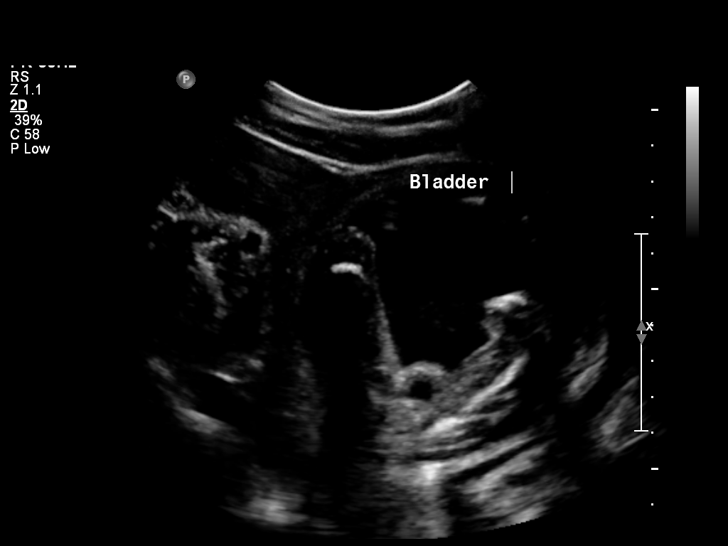
[im 52/57]
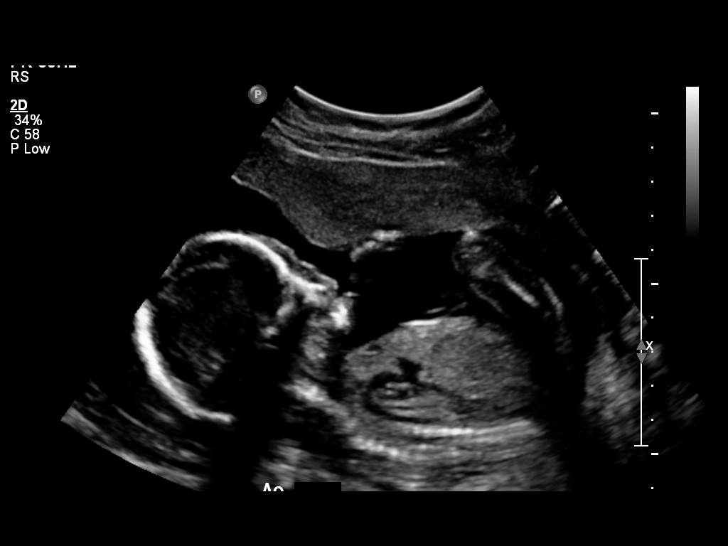
[im 57/57]
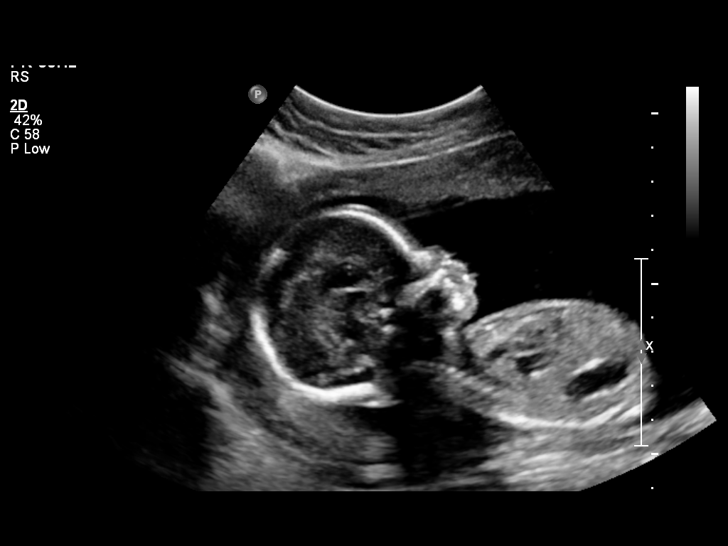

[Series 1: us ob detail +14 wk · 1 of 8 slices shown (2 of 2)]
[im 4/8]
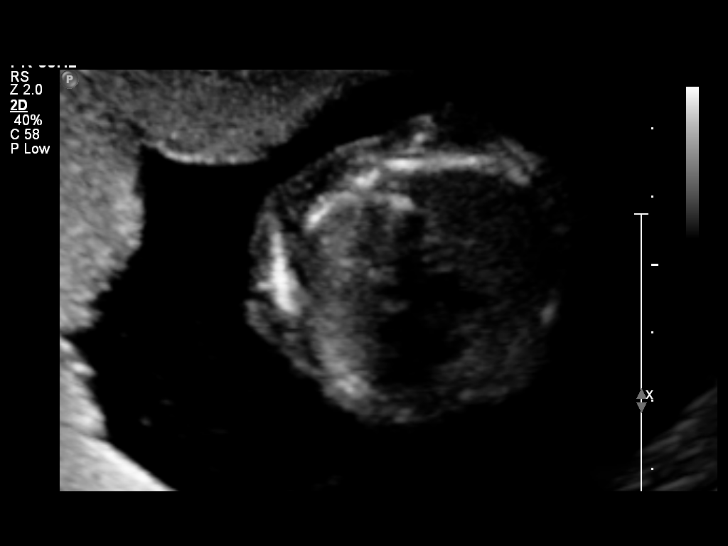

[12 of 28 positions shown; findings below may reference images not displayed]

OBSTETRICS REPORT
                      (Signed Final 01/31/2011 [DATE])

 Order#:         [id number]
Procedures

 US OB DETAIL + 14 WK                                  76811.0
Indications

 Detailed fetal anatomic survey
Fetal Evaluation

 Fetal Heart Rate:  143                          bpm
 Cardiac Activity:  Observed
 Presentation:      Variable
 Placenta:          Posterior Fundal, above
                    cervical os
 P. Cord Insertion: Visualized

 Amniotic Fluid
 AFI FV:      Subjectively within normal limits
                                              Larg Pckt:    6.9  cm
Biometry

 BPD:     45.8  mm     G. Age:  19w 6d                CI:          71.7  70 - 86
                                                      FL/HC:       19.0  15.9 -

 HC:     172.2  mm     G. Age:  19w 6d        3  %    HC/AC:       1.13  1.06 -

 AC:     152.9  mm     G. Age:  20w 3d       23  %    FL/BPD:
 FL:      32.8  mm     G. Age:  20w 2d       16  %    FL/AC:       21.5  20 - 24
 HUM:     32.6  mm     G. Age:  21w 0d       42  %

 Est. FW:     345  gm     0 lb 12 oz     26  %
Gestational Age

 LMP:           21w 1d        Date:  09/05/10                 EDD:   06/12/11
 U/S Today:     20w 1d                                        EDD:   06/19/11
 Best:          21w 1d     Det. By:  LMP  (09/05/10)          EDD:   06/12/11
Anatomy
 Cranium:           Appears normal      Aortic Arch:       Appears normal
 Fetal Cavum:       Appears normal      Ductal Arch:       Appears normal
 Ventricles:        Appears normal      Diaphragm:         Appears normal
 Choroid Plexus:    Appears normal      Stomach:           Appears normal,
                                                           left sided
 Cerebellum:        Appears normal      Abdomen:           Appears normal
 Posterior Fossa:   Appears normal      Abdominal Wall:    Appears nml
                                                           (cord insert, abd
                                                           wall)
 Nuchal Fold:       Not applicable      Cord Vessels:      Appears normal
                    (>20 wks GA)                           (3 vessel cord)
 Face:              Appears normal      Kidneys:           Appear normal
                    (lips/profile/orbits
                    )
 Heart:             Appears normal      Bladder:           Appears normal
                    (4 chamber &
                    axis)
 RVOT:              Appears normal      Spine:             Appears normal
 LVOT:              Appears normal      Limbs:             Appears normal
                                                           (hands, ankles,
                                                           feet)

 Other:     Female gender. Heels and 5th digit visualized. Nasal bone
            visualized.
Cervix Uterus Adnexa

 Cervical Length:    3.71      cm

 Cervix:       Closed.

 Adnexa:     No abnormality visualized.
Impression

 Siup demonstrating an EGA by ultrasound of 20w 1d. This
 corresponds well with expected EGA by LMP of 21w 1d.

 No focal fetal or placental abnormalities are noted with a good
 anatomic evaluation possible. No soft markers for Down
 Syndrome are seen. Given the expected age at delivery of 30,
 today's normal ultrasound would decrease the age related risk
 for Down Syndrome from [DATE] to [DATE] (Pozisa et al).
 Correlation with other aneuploidy screening results, if
 available, would be recommended for a more complete risk
 assessment.

 Subjectively and quantitatively normal amniotic fluid volume.
 Normal cervical length.

## 2013-07-25 ENCOUNTER — Encounter (HOSPITAL_COMMUNITY): Payer: Self-pay | Admitting: Emergency Medicine

## 2013-07-25 ENCOUNTER — Emergency Department (HOSPITAL_COMMUNITY)
Admission: EM | Admit: 2013-07-25 | Discharge: 2013-07-25 | Disposition: A | Discharge status: Home / Self Care | Payer: Medicaid Other | Attending: Emergency Medicine | Admitting: Emergency Medicine

## 2013-07-25 DIAGNOSIS — Z3202 Encounter for pregnancy test, result negative: Secondary | ICD-10-CM | POA: Insufficient documentation

## 2013-07-25 DIAGNOSIS — Z87891 Personal history of nicotine dependence: Secondary | ICD-10-CM | POA: Insufficient documentation

## 2013-07-25 DIAGNOSIS — K5289 Other specified noninfective gastroenteritis and colitis: Secondary | ICD-10-CM | POA: Insufficient documentation

## 2013-07-25 DIAGNOSIS — Z8619 Personal history of other infectious and parasitic diseases: Secondary | ICD-10-CM | POA: Insufficient documentation

## 2013-07-25 DIAGNOSIS — I10 Essential (primary) hypertension: Secondary | ICD-10-CM | POA: Insufficient documentation

## 2013-07-25 DIAGNOSIS — K529 Noninfective gastroenteritis and colitis, unspecified: Secondary | ICD-10-CM

## 2013-07-25 LAB — CBC WITH DIFFERENTIAL/PLATELET
BASOS PCT: 0 % (ref 0–1)
Basophils Absolute: 0 10*3/uL (ref 0.0–0.1)
EOS ABS: 0 10*3/uL (ref 0.0–0.7)
EOS PCT: 0 % (ref 0–5)
HCT: 39.7 % (ref 36.0–46.0)
Hemoglobin: 14 g/dL (ref 12.0–15.0)
LYMPHS ABS: 3.2 10*3/uL (ref 0.7–4.0)
Lymphocytes Relative: 30 % (ref 12–46)
MCH: 30.9 pg (ref 26.0–34.0)
MCHC: 35.3 g/dL (ref 30.0–36.0)
MCV: 87.6 fL (ref 78.0–100.0)
Monocytes Absolute: 0.3 10*3/uL (ref 0.1–1.0)
Monocytes Relative: 3 % (ref 3–12)
Neutro Abs: 7 10*3/uL (ref 1.7–7.7)
Neutrophils Relative %: 66 % (ref 43–77)
PLATELETS: 278 10*3/uL (ref 150–400)
RBC: 4.53 MIL/uL (ref 3.87–5.11)
RDW: 12.2 % (ref 11.5–15.5)
WBC: 10.6 10*3/uL — ABNORMAL HIGH (ref 4.0–10.5)

## 2013-07-25 LAB — COMPREHENSIVE METABOLIC PANEL
ALT: 34 U/L (ref 0–35)
AST: 24 U/L (ref 0–37)
Albumin: 4.3 g/dL (ref 3.5–5.2)
Alkaline Phosphatase: 101 U/L (ref 39–117)
BUN: 6 mg/dL (ref 6–23)
CALCIUM: 9.4 mg/dL (ref 8.4–10.5)
CO2: 23 mEq/L (ref 19–32)
Chloride: 104 mEq/L (ref 96–112)
Creatinine, Ser: 0.85 mg/dL (ref 0.50–1.10)
GFR calc non Af Amer: 88 mL/min — ABNORMAL LOW (ref >90)
GLUCOSE: 91 mg/dL (ref 70–99)
Potassium: 4.4 mEq/L (ref 3.7–5.3)
SODIUM: 141 meq/L (ref 137–147)
TOTAL PROTEIN: 7.6 g/dL (ref 6.0–8.3)
Total Bilirubin: 0.3 mg/dL (ref 0.3–1.2)

## 2013-07-25 LAB — URINALYSIS, ROUTINE W REFLEX MICROSCOPIC
BILIRUBIN URINE: NEGATIVE
Glucose, UA: NEGATIVE mg/dL
Ketones, ur: NEGATIVE mg/dL
NITRITE: NEGATIVE
PH: 6 (ref 5.0–8.0)
Protein, ur: NEGATIVE mg/dL
SPECIFIC GRAVITY, URINE: 1.009 (ref 1.005–1.030)
Urobilinogen, UA: 0.2 mg/dL (ref 0.0–1.0)

## 2013-07-25 LAB — URINE MICROSCOPIC-ADD ON

## 2013-07-25 LAB — POC URINE PREG, ED: Preg Test, Ur: NEGATIVE

## 2013-07-25 MED ORDER — ONDANSETRON HCL 4 MG/2ML IJ SOLN
4.0000 mg | Freq: Once | INTRAMUSCULAR | Status: AC
  Administered 2013-07-25: 4 mg via INTRAVENOUS
  Filled 2013-07-25: qty 2

## 2013-07-25 MED ORDER — SODIUM CHLORIDE 0.9 % IV BOLUS (SEPSIS)
1000.0000 mL | Freq: Once | INTRAVENOUS | Status: AC
  Administered 2013-07-25: 1000 mL via INTRAVENOUS

## 2013-07-25 MED ORDER — PROMETHAZINE HCL 25 MG PO TABS: 25.0000 mg | ORAL_TABLET | Freq: Four times a day (QID) | ORAL | Status: DC | PRN

## 2013-07-25 MED ORDER — DIPHENOXYLATE-ATROPINE 2.5-0.025 MG PO TABS: 1.0000 | ORAL_TABLET | Freq: Four times a day (QID) | ORAL | Status: DC | PRN

## 2013-07-25 NOTE — ED Notes (Signed)
Pt states nausea/vomiting/abdominal pain for a few days.  Headache also.  Not keeping foods down.

## 2013-07-25 NOTE — ED Provider Notes (Signed)
CSN: [id number]     Arrival date & time 07/25/13  1120 History   First MD Initiated Contact with Patient 07/25/13 1150     Chief Complaint  Patient presents with  . Emesis  . Abdominal Pain     (Consider location/radiation/quality/duration/timing/severity/associated sxs/prior Treatment) HPI...Marland Kitchen.Marland Kitchen. several episodes of nausea vomiting and diarrhea the past 4 days. No blood or mucus in her stool. She does not normally have gastrointestinal problems. A coworker had similar symptoms.  Severity is mild to moderate. Nothing makes symptoms better or worse. Slight abdominal cramping.  Past Medical History  Diagnosis Date  . Herpes   . Hypertension    Past Surgical History  Procedure Laterality Date  . Tooth extraction  12-2010   Family History  Problem Relation Age of Onset  . Anesthesia problems Neg Hx   . Hypotension Neg Hx   . Malignant hyperthermia Neg Hx   . Pseudochol deficiency Neg Hx   . Other Neg Hx   . Heart disease Mother   . Vision loss Father    History  Substance Use Topics  . Smoking status: Former Smoker    Quit date: 02/03/2011  . Smokeless tobacco: Never Used  . Alcohol Use: No   OB History   Grav Para Term Preterm Abortions TAB SAB Ect Mult Living   2 2 2  0 0 0 0 0 0 2     Review of Systems  All other systems reviewed and are negative.     Allergies  Review of patient's allergies indicates no known allergies.  Home Medications   Prior to Admission medications   Medication Sig Start Date End Date Taking? Authorizing Provider  acetaminophen (TYLENOL) 325 MG tablet Take 650 mg by mouth every 6 (six) hours as needed for headache.   Yes Historical Provider, MD  diphenoxylate-atropine (LOMOTIL) 2.5-0.025 MG per tablet Take 1 tablet by mouth 4 (four) times daily as needed for diarrhea or loose stools. 07/25/13   Donnetta HutchingBrian Quynn Vilchis, MD  promethazine (PHENERGAN) 25 MG tablet Take 1 tablet (25 mg total) by mouth every 6 (six) hours as needed for nausea. 07/25/13   Donnetta HutchingBrian  Manolo Bosket, MD   BP 140/88  Pulse 85  Temp(Src) 97.7 F (36.5 C) (Oral)  Resp 16  SpO2 99% Physical Exam  Nursing note and vitals reviewed. Constitutional: She is oriented to person, place, and time. She appears well-developed and well-nourished.  HENT:  Head: Normocephalic and atraumatic.  Eyes: Conjunctivae and EOM are normal. Pupils are equal, round, and reactive to light.  Neck: Normal range of motion. Neck supple.  Cardiovascular: Normal rate, regular rhythm and normal heart sounds.   Pulmonary/Chest: Effort normal and breath sounds normal.  Abdominal: Soft. Bowel sounds are normal.  No abdominal tenderness  Musculoskeletal: Normal range of motion.  Neurological: She is alert and oriented to person, place, and time.  Skin: Skin is warm and dry.  Psychiatric: She has a normal mood and affect. Her behavior is normal.    ED Course  Procedures (including critical care time) Labs Review Labs Reviewed  CBC WITH DIFFERENTIAL - Abnormal; Notable for the following:    WBC 10.6 (*)    All other components within normal limits  COMPREHENSIVE METABOLIC PANEL - Abnormal; Notable for the following:    GFR calc non Af Amer 88 (*)    All other components within normal limits  URINALYSIS, ROUTINE W REFLEX MICROSCOPIC - Abnormal; Notable for the following:    Hgb urine dipstick TRACE (*)  Leukocytes, UA TRACE (*)    All other components within normal limits  URINE MICROSCOPIC-ADD ON - Abnormal; Notable for the following:    Bacteria, UA FEW (*)    All other components within normal limits  POC URINE PREG, ED    Imaging Review No results found.   EKG Interpretation None      MDM   Final diagnoses:  Gastroenteritis    No acute abdomen. Patient feels better after 2 L of IV fluids. Discharge medications Phenergan 25 mg and Lomotil    Donnetta HutchingBrian Karthika Glasper, MD 07/25/13 1455

## 2013-07-25 NOTE — Progress Notes (Signed)
[facility] provided pt with a list of primary care resources and a [facility] Orange Card application to help patient establish primary care.  °

## 2013-07-25 NOTE — Discharge Instructions (Signed)
Increase fluids. Medication for nausea and diarrhea.

## 2013-09-14 ENCOUNTER — Emergency Department (HOSPITAL_COMMUNITY)
Admission: EM | Admit: 2013-09-14 | Discharge: 2013-09-14 | Disposition: A | Discharge status: Home / Self Care | Payer: Medicaid Other | Attending: Emergency Medicine | Admitting: Emergency Medicine

## 2013-09-14 ENCOUNTER — Encounter (HOSPITAL_COMMUNITY): Payer: Self-pay | Admitting: Emergency Medicine

## 2013-09-14 DIAGNOSIS — F17200 Nicotine dependence, unspecified, uncomplicated: Secondary | ICD-10-CM | POA: Insufficient documentation

## 2013-09-14 DIAGNOSIS — B9789 Other viral agents as the cause of diseases classified elsewhere: Secondary | ICD-10-CM | POA: Insufficient documentation

## 2013-09-14 DIAGNOSIS — R1110 Vomiting, unspecified: Secondary | ICD-10-CM

## 2013-09-14 DIAGNOSIS — B349 Viral infection, unspecified: Secondary | ICD-10-CM

## 2013-09-14 DIAGNOSIS — I10 Essential (primary) hypertension: Secondary | ICD-10-CM | POA: Insufficient documentation

## 2013-09-14 DIAGNOSIS — R519 Headache, unspecified: Secondary | ICD-10-CM

## 2013-09-14 DIAGNOSIS — R51 Headache: Secondary | ICD-10-CM

## 2013-09-14 DIAGNOSIS — Z8619 Personal history of other infectious and parasitic diseases: Secondary | ICD-10-CM | POA: Insufficient documentation

## 2013-09-14 DIAGNOSIS — R197 Diarrhea, unspecified: Secondary | ICD-10-CM

## 2013-09-14 LAB — URINALYSIS, ROUTINE W REFLEX MICROSCOPIC
Bilirubin Urine: NEGATIVE
GLUCOSE, UA: NEGATIVE mg/dL
Ketones, ur: NEGATIVE mg/dL
Nitrite: NEGATIVE
Protein, ur: NEGATIVE mg/dL
SPECIFIC GRAVITY, URINE: 1.003 — AB (ref 1.005–1.030)
UROBILINOGEN UA: 0.2 mg/dL (ref 0.0–1.0)
pH: 6.5 (ref 5.0–8.0)

## 2013-09-14 LAB — CBC WITH DIFFERENTIAL/PLATELET
BASOS ABS: 0 10*3/uL (ref 0.0–0.1)
BASOS PCT: 0 % (ref 0–1)
EOS PCT: 1 % (ref 0–5)
Eosinophils Absolute: 0.1 10*3/uL (ref 0.0–0.7)
HEMATOCRIT: 38.7 % (ref 36.0–46.0)
Hemoglobin: 13.6 g/dL (ref 12.0–15.0)
Lymphocytes Relative: 29 % (ref 12–46)
Lymphs Abs: 2.5 10*3/uL (ref 0.7–4.0)
MCH: 30.4 pg (ref 26.0–34.0)
MCHC: 35.1 g/dL (ref 30.0–36.0)
MCV: 86.6 fL (ref 78.0–100.0)
MONO ABS: 0.3 10*3/uL (ref 0.1–1.0)
Monocytes Relative: 3 % (ref 3–12)
Neutro Abs: 5.9 10*3/uL (ref 1.7–7.7)
Neutrophils Relative %: 67 % (ref 43–77)
Platelets: 264 10*3/uL (ref 150–400)
RBC: 4.47 MIL/uL (ref 3.87–5.11)
RDW: 12.4 % (ref 11.5–15.5)
WBC: 8.8 10*3/uL (ref 4.0–10.5)

## 2013-09-14 LAB — COMPREHENSIVE METABOLIC PANEL
ALBUMIN: 4 g/dL (ref 3.5–5.2)
ALT: 21 U/L (ref 0–35)
AST: 22 U/L (ref 0–37)
Alkaline Phosphatase: 91 U/L (ref 39–117)
BUN: 7 mg/dL (ref 6–23)
CALCIUM: 9.3 mg/dL (ref 8.4–10.5)
CO2: 23 mEq/L (ref 19–32)
CREATININE: 0.87 mg/dL (ref 0.50–1.10)
Chloride: 104 mEq/L (ref 96–112)
GFR calc Af Amer: >90 mL/min (ref >90)
GFR, EST NON AFRICAN AMERICAN: 85 mL/min — AB (ref >90)
Glucose, Bld: 92 mg/dL (ref 70–99)
Potassium: 3.4 mEq/L — ABNORMAL LOW (ref 3.7–5.3)
Sodium: 139 mEq/L (ref 137–147)
Total Bilirubin: 0.6 mg/dL (ref 0.3–1.2)
Total Protein: 7.1 g/dL (ref 6.0–8.3)

## 2013-09-14 LAB — URINE MICROSCOPIC-ADD ON

## 2013-09-14 MED ORDER — ONDANSETRON HCL 4 MG/2ML IJ SOLN
4.0000 mg | Freq: Once | INTRAMUSCULAR | Status: AC
  Administered 2013-09-14: 4 mg via INTRAVENOUS
  Filled 2013-09-14: qty 2

## 2013-09-14 MED ORDER — ONDANSETRON HCL 4 MG PO TABS: 4.0000 mg | ORAL_TABLET | Freq: Four times a day (QID) | ORAL | Status: DC

## 2013-09-14 MED ORDER — SODIUM CHLORIDE 0.9 % IV BOLUS (SEPSIS)
1000.0000 mL | Freq: Once | INTRAVENOUS | Status: AC
  Administered 2013-09-14: 1000 mL via INTRAVENOUS

## 2013-09-14 MED ORDER — DIPHENOXYLATE-ATROPINE 2.5-0.025 MG PO TABS: 2.0000 | ORAL_TABLET | Freq: Four times a day (QID) | ORAL | Status: DC | PRN

## 2013-09-14 MED ORDER — DIPHENOXYLATE-ATROPINE 2.5-0.025 MG PO TABS
2.0000 | ORAL_TABLET | Freq: Once | ORAL | Status: AC
  Administered 2013-09-14: 2 via ORAL
  Filled 2013-09-14: qty 2

## 2013-09-14 NOTE — Discharge Instructions (Signed)
Nausea and Vomiting °Nausea is a sick feeling that often comes before throwing up (vomiting). Vomiting is a reflex where stomach contents come out of your mouth. Vomiting can cause severe loss of body fluids (dehydration). Children and elderly adults can become dehydrated quickly, especially if they also have diarrhea. Nausea and vomiting are symptoms of a condition or disease. It is important to find the cause of your symptoms. °CAUSES  °· Direct irritation of the stomach lining. This irritation can result from increased acid production (gastroesophageal reflux disease), infection, food poisoning, taking certain medicines (such as nonsteroidal anti-inflammatory drugs), alcohol use, or tobacco use. °· Signals from the brain. These signals could be caused by a headache, heat exposure, an inner ear disturbance, increased pressure in the brain from injury, infection, a tumor, or a concussion, pain, emotional stimulus, or metabolic problems. °· An obstruction in the gastrointestinal tract (bowel obstruction). °· Illnesses such as diabetes, hepatitis, gallbladder problems, appendicitis, kidney problems, cancer, sepsis, atypical symptoms of a heart attack, or eating disorders. °· Medical treatments such as chemotherapy and radiation. °· Receiving medicine that makes you sleep (general anesthetic) during surgery. °DIAGNOSIS °Your caregiver may ask for tests to be done if the problems do not improve after a few days. Tests may also be done if symptoms are severe or if the reason for the nausea and vomiting is not clear. Tests may include: °· Urine tests. °· Blood tests. °· Stool tests. °· Cultures (to look for evidence of infection). °· X-rays or other imaging studies. °Test results can help your caregiver make decisions about treatment or the need for additional tests. °TREATMENT °You need to stay well hydrated. Drink frequently but in small amounts. You may wish to drink water, sports drinks, clear broth, or eat frozen  ice pops or gelatin dessert to help stay hydrated. When you eat, eating slowly may help prevent nausea. There are also some antinausea medicines that may help prevent nausea. °HOME CARE INSTRUCTIONS  °· Take all medicine as directed by your caregiver. °· If you do not have an appetite, do not force yourself to eat. However, you must continue to drink fluids. °· If you have an appetite, eat a normal diet unless your caregiver tells you differently. °· Eat a variety of complex carbohydrates (rice, wheat, potatoes, bread), lean meats, yogurt, fruits, and vegetables. °· Avoid high-fat foods because they are more difficult to digest. °· Drink enough water and fluids to keep your urine clear or pale yellow. °· If you are dehydrated, ask your caregiver for specific rehydration instructions. Signs of dehydration may include: °· Severe thirst. °· Dry lips and mouth. °· Dizziness. °· Dark urine. °· Decreasing urine frequency and amount. °· Confusion. °· Rapid breathing or pulse. °SEEK IMMEDIATE MEDICAL CARE IF:  °· You have blood or brown flecks (like coffee grounds) in your vomit. °· You have black or bloody stools. °· You have a severe headache or stiff neck. °· You are confused. °· You have severe abdominal pain. °· You have chest pain or trouble breathing. °· You do not urinate at least once every 8 hours. °· You develop cold or clammy skin. °· You continue to vomit for longer than 24 to 48 hours. °· You have a fever. °MAKE SURE YOU:  °· Understand these instructions. °· Will watch your condition. °· Will get help right away if you are not doing well or get worse. °Document Released: 03/24/2005 Document Revised: 06/16/2011 Document Reviewed: 08/21/2010 °ExitCare® Patient Information ©2014 [employer]. ° °Viral Infections °A viral   infection can be caused by different types of viruses.Most viral infections are not serious and resolve on their own. However, some infections may cause severe symptoms and may lead to further  complications. SYMPTOMS Viruses can frequently cause:  Minor sore throat.  Aches and pains.  Headaches.  Runny nose.  Different types of rashes.  Watery eyes.  Tiredness.  Cough.  Loss of appetite.  Gastrointestinal infections, resulting in nausea, vomiting, and diarrhea. These symptoms do not respond to antibiotics because the infection is not caused by bacteria. However, you might catch a bacterial infection following the viral infection. This is sometimes called a "superinfection." Symptoms of such a bacterial infection may include:  Worsening sore throat with pus and difficulty swallowing.  Swollen neck glands.  Chills and a high or persistent fever.  Severe headache.  Tenderness over the sinuses.  Persistent overall ill feeling (malaise), muscle aches, and tiredness (fatigue).  Persistent cough.  Yellow, green, or brown mucus production with coughing. HOME CARE INSTRUCTIONS   Only take over-the-counter or prescription medicines for pain, discomfort, diarrhea, or fever as directed by your caregiver.  Drink enough water and fluids to keep your urine clear or pale yellow. Sports drinks can provide valuable electrolytes, sugars, and hydration.  Get plenty of rest and maintain proper nutrition. Soups and broths with crackers or rice are fine. SEEK IMMEDIATE MEDICAL CARE IF:   You have severe headaches, shortness of breath, chest pain, neck pain, or an unusual rash.  You have uncontrolled vomiting, diarrhea, or you are unable to keep down fluids.  You or your child has an oral temperature above 102 F (38.9 C), not controlled by medicine.  Your baby is older than 3 months with a rectal temperature of 102 F (38.9 C) or higher.  Your baby is 9 months old or younger with a rectal temperature of 100.4 F (38 C) or higher. MAKE SURE YOU:   Understand these instructions.  Will watch your condition.  Will get help right away if you are not doing well or get  worse. Document Released: 01/01/2005 Document Revised: 06/16/2011 Document Reviewed: 07/29/2010 [facility] Patient Information 2014 [place], [place]. Diarrhea Diarrhea is frequent loose and watery bowel movements. It can cause you to feel weak and dehydrated. Dehydration can cause you to become tired and thirsty, have a dry mouth, and have decreased urination that often is dark yellow. Diarrhea is a sign of another problem, most often an infection that will not last long. In most cases, diarrhea typically lasts 2 3 days. However, it can last longer if it is a sign of something more serious. It is important to treat your diarrhea as directed by your caregive to lessen or prevent future episodes of diarrhea. CAUSES  Some common causes include:  Gastrointestinal infections caused by viruses, bacteria, or parasites.  Food poisoning or food allergies.  Certain medicines, such as antibiotics, chemotherapy, and laxatives.  Artificial sweeteners and fructose.  Digestive disorders. HOME CARE INSTRUCTIONS  Ensure adequate fluid intake (hydration): have 1 cup (8 oz) of fluid for each diarrhea episode. Avoid fluids that contain simple sugars or sports drinks, fruit juices, whole milk products, and sodas. Your urine should be clear or pale yellow if you are drinking enough fluids. Hydrate with an oral rehydration solution that you can purchase at pharmacies, retail stores, and online. You can prepare an oral rehydration solution at home by mixing the following ingredients together:    tsp table salt.   tsp baking soda.   tsp  salt substitute containing potassium chloride.  1  tablespoons sugar.  1 L (34 oz) of water.  Certain foods and beverages may increase the speed at which food moves through the gastrointestinal (GI) tract. These foods and beverages should be avoided and include:  Caffeinated and alcoholic beverages.  High-fiber foods, such as raw fruits and vegetables, nuts, seeds, and whole  grain breads and cereals.  Foods and beverages sweetened with sugar alcohols, such as xylitol, sorbitol, and mannitol.  Some foods may be well tolerated and may help thicken stool including:  Starchy foods, such as rice, toast, pasta, low-sugar cereal, oatmeal, grits, baked potatoes, crackers, and bagels.  Bananas.  Applesauce.  Add probiotic-rich foods to help increase healthy bacteria in the GI tract, such as yogurt and fermented milk products.  Wash your hands well after each diarrhea episode.  Only take over-the-counter or prescription medicines as directed by your caregiver.  Take a warm bath to relieve any burning or pain from frequent diarrhea episodes. SEEK IMMEDIATE MEDICAL CARE IF:   You are unable to keep fluids down.  You have persistent vomiting.  You have blood in your stool, or your stools are black and tarry.  You do not urinate in 6 8 hours, or there is only a small amount of very dark urine.  You have abdominal pain that increases or localizes.  You have weakness, dizziness, confusion, or lightheadedness.  You have a severe headache.  Your diarrhea gets worse or does not get better.  You have a fever or persistent symptoms for more than 2 3 days.  You have a fever and your symptoms suddenly get worse. MAKE SURE YOU:   Understand these instructions.  Will watch your condition.  Will get help right away if you are not doing well or get worse. Document Released: 03/14/2002 Document Revised: 03/10/2012 Document Reviewed: 11/30/2011 [facility] Patient Information 2014 [place], [place].

## 2013-09-14 NOTE — ED Notes (Signed)
Pt reports since Monday headache 4/10, night sweats, vomiting, diarrhea, and chills, and insomnia. Vomiting and diarrhea started yesterday. Pt took tylenol around 0400.

## 2013-09-14 NOTE — Progress Notes (Signed)
[facility] provided pt with a list of primary care resources to help patient establish a pcp.  °

## 2013-09-14 NOTE — ED Provider Notes (Signed)
CSN: [id number]     Arrival date & time 09/14/13  0734 History   First MD Initiated Contact with Patient 09/14/13 0744     Chief Complaint  Patient presents with  . Diarrhea  . Headache  . cold sweats, insomnia      (Consider location/radiation/quality/duration/timing/severity/associated sxs/prior Treatment) HPI Comments: Patient presents to the ER for headache, body, chills, sweats. Symptoms began 3 days ago. Patient reports that she feels worse at night, has been having trouble sleeping. Yesterday she started experiencing nausea, vomiting and diarrhea. She has associated abdominal cramping, around the time which has diarrhea, but no continuous abdominal pain.  Patient is a 33 y.o. female presenting with diarrhea and headaches.  Diarrhea Associated symptoms: headaches, myalgias and vomiting   Headache Associated symptoms: diarrhea, myalgias and vomiting     Past Medical History  Diagnosis Date  . Herpes   . Hypertension    Past Surgical History  Procedure Laterality Date  . Tooth extraction  12-2010   Family History  Problem Relation Age of Onset  . Anesthesia problems Neg Hx   . Hypotension Neg Hx   . Malignant hyperthermia Neg Hx   . Pseudochol deficiency Neg Hx   . Other Neg Hx   . Heart disease Mother   . Vision loss Father    History  Substance Use Topics  . Smoking status: Light Tobacco Smoker -- 5 years    Last Attempt to Quit: 02/03/2011  . Smokeless tobacco: Never Used  . Alcohol Use: No   OB History   Grav Para Term Preterm Abortions TAB SAB Ect Mult Living   2 2 2  0 0 0 0 0 0 2     Review of Systems  Gastrointestinal: Positive for vomiting and diarrhea.  Musculoskeletal: Positive for myalgias.  Neurological: Positive for headaches.  All other systems reviewed and are negative.     Allergies  Review of patient's allergies indicates no known allergies.  Home Medications   Prior to Admission medications   Medication Sig Start Date End Date  Taking? Authorizing Provider  acetaminophen (TYLENOL) 500 MG tablet Take 1,000 mg by mouth every 6 (six) hours as needed for headache.   Yes Historical Provider, MD  Multiple Vitamin (MULTIVITAMIN WITH MINERALS) TABS tablet Take 1 tablet by mouth every morning.   Yes Historical Provider, MD   BP 138/93  Pulse 89  Temp(Src) 98.2 F (36.8 C) (Oral)  Resp 16  SpO2 100% Physical Exam  Constitutional: She is oriented to person, place, and time. She appears well-developed and well-nourished. No distress.  HENT:  Head: Normocephalic and atraumatic.  Right Ear: Hearing normal.  Left Ear: Hearing normal.  Nose: Nose normal.  Mouth/Throat: Oropharynx is clear and moist and mucous membranes are normal.  Eyes: Conjunctivae and EOM are normal. Pupils are equal, round, and reactive to light.  Neck: Normal range of motion. Neck supple. No Brudzinski's sign and no Kernig's sign noted.  Cardiovascular: Regular rhythm, S1 normal and S2 normal.  Exam reveals no gallop and no friction rub.   No murmur heard. Pulmonary/Chest: Effort normal and breath sounds normal. No respiratory distress. She exhibits no tenderness.  Abdominal: Soft. Normal appearance and bowel sounds are normal. There is no hepatosplenomegaly. There is no tenderness. There is no rebound, no guarding, no tenderness at McBurney's point and negative Murphy's sign. No hernia.  Musculoskeletal: Normal range of motion.  Neurological: She is alert and oriented to person, place, and time. She has normal strength. No  cranial nerve deficit or sensory deficit. Coordination normal. GCS eye subscore is 4. GCS verbal subscore is 5. GCS motor subscore is 6.  Skin: Skin is warm, dry and intact. No rash noted. No cyanosis.  Psychiatric: She has a normal mood and affect. Her speech is normal and behavior is normal. Thought content normal.    ED Course  Procedures (including critical care time) Labs Review Labs Reviewed  COMPREHENSIVE METABOLIC PANEL -  Abnormal; Notable for the following:    Potassium 3.4 (*)    GFR calc non Af Amer 85 (*)    All other components within normal limits  URINALYSIS, ROUTINE W REFLEX MICROSCOPIC - Abnormal; Notable for the following:    Specific Gravity, Urine 1.003 (*)    Hgb urine dipstick TRACE (*)    Leukocytes, UA TRACE (*)    All other components within normal limits  CBC WITH DIFFERENTIAL  URINE MICROSCOPIC-ADD ON    Imaging Review No results found.   EKG Interpretation None      MDM   Final diagnoses:  None   viral syndrome  Presents to the ER for evaluation of viral syndrome type symptoms. She is experiencing mild headache with myalgias. She describes intermittent chills and sweats, but has not taken her temperature. She is afebrile here. All vital signs are normal. She has had nausea, vomiting and diarrhea. Abdominal exam, however, is benign. Lab work is entirely normal. Patient treated with fluids, Zofran, Lomotil. Will be discharged with continued symptomatic treatment.    Gilda Creasehristopher J. Pollina, MD 09/14/13 [phone]

## 2013-09-18 ENCOUNTER — Encounter (HOSPITAL_COMMUNITY): Payer: Self-pay | Admitting: Emergency Medicine

## 2013-09-18 ENCOUNTER — Emergency Department (HOSPITAL_COMMUNITY)
Admission: EM | Admit: 2013-09-18 | Discharge: 2013-09-18 | Disposition: A | Discharge status: Home / Self Care | Payer: Medicaid Other | Attending: Emergency Medicine | Admitting: Emergency Medicine

## 2013-09-18 DIAGNOSIS — F17200 Nicotine dependence, unspecified, uncomplicated: Secondary | ICD-10-CM | POA: Insufficient documentation

## 2013-09-18 DIAGNOSIS — Z8619 Personal history of other infectious and parasitic diseases: Secondary | ICD-10-CM | POA: Insufficient documentation

## 2013-09-18 DIAGNOSIS — L02419 Cutaneous abscess of limb, unspecified: Secondary | ICD-10-CM | POA: Insufficient documentation

## 2013-09-18 DIAGNOSIS — L03119 Cellulitis of unspecified part of limb: Principal | ICD-10-CM

## 2013-09-18 DIAGNOSIS — I10 Essential (primary) hypertension: Secondary | ICD-10-CM | POA: Insufficient documentation

## 2013-09-18 DIAGNOSIS — L02415 Cutaneous abscess of right lower limb: Secondary | ICD-10-CM

## 2013-09-18 MED ORDER — CEPHALEXIN 500 MG PO CAPS: 500.0000 mg | ORAL_CAPSULE | Freq: Three times a day (TID) | ORAL | Status: DC

## 2013-09-18 MED ORDER — OXYCODONE-ACETAMINOPHEN 5-325 MG PO TABS
1.0000 | ORAL_TABLET | Freq: Once | ORAL | Status: AC
  Administered 2013-09-18: 1 via ORAL
  Filled 2013-09-18 (×2): qty 1

## 2013-09-18 MED ORDER — OXYCODONE-ACETAMINOPHEN 5-325 MG PO TABS: 1.0000 | ORAL_TABLET | Freq: Four times a day (QID) | ORAL | Status: DC | PRN

## 2013-09-18 MED ORDER — SULFAMETHOXAZOLE-TMP DS 800-160 MG PO TABS: 1.0000 | ORAL_TABLET | Freq: Three times a day (TID) | ORAL | Status: DC

## 2013-09-18 NOTE — ED Notes (Signed)
Pt reports she felt something on Friday possibly bite her upper leg. Pt has a red circular area with white center. Pain 6/10.

## 2013-09-18 NOTE — Discharge Instructions (Signed)
Abscess An abscess is an infected area that contains a collection of pus and debris.It can occur in almost any part of the body. An abscess is also known as a furuncle or boil. CAUSES  An abscess occurs when tissue gets infected. This can occur from blockage of oil or sweat glands, infection of hair follicles, or a minor injury to the skin. As the body tries to fight the infection, pus collects in the area and creates pressure under the skin. This pressure causes pain. People with weakened immune systems have difficulty fighting infections and get certain abscesses more often.  SYMPTOMS Usually an abscess develops on the skin and becomes a painful mass that is red, warm, and tender. If the abscess forms under the skin, you may feel a moveable soft area under the skin. Some abscesses break open (rupture) on their own, but most will continue to get worse without care. The infection can spread deeper into the body and eventually into the bloodstream, causing you to feel ill.  DIAGNOSIS  Your caregiver will take your medical history and perform a physical exam. A sample of fluid may also be taken from the abscess to determine what is causing your infection. TREATMENT  Your caregiver may prescribe antibiotic medicines to fight the infection. However, taking antibiotics alone usually does not cure an abscess. Your caregiver may need to make a small cut (incision) in the abscess to drain the pus. In some cases, gauze is packed into the abscess to reduce pain and to continue draining the area. HOME CARE INSTRUCTIONS   Only take over-the-counter or prescription medicines for pain, discomfort, or fever as directed by your caregiver.  If you were prescribed antibiotics, take them as directed. Finish them even if you start to feel better.  If gauze is used, follow your caregiver's directions for changing the gauze.  To avoid spreading the infection:  Keep your draining abscess covered with a  bandage.  Wash your hands well.  Do not share personal care items, towels, or whirlpools with others.  Avoid skin contact with others.  Keep your skin and clothes clean around the abscess.  Keep all follow-up appointments as directed by your caregiver. SEEK MEDICAL CARE IF:   You have increased pain, swelling, redness, fluid drainage, or bleeding.  You have muscle aches, chills, or a general ill feeling.  You have a fever. MAKE SURE YOU:   Understand these instructions.  Will watch your condition.  Will get help right away if you are not doing well or get worse. Document Released: 01/01/2005 Document Revised: 09/23/2011 Document Reviewed: 06/06/2011 [employer] Patient Information 2014 [employer].  

## 2013-09-18 NOTE — ED Provider Notes (Signed)
CSN: [id number]     Arrival date & time 09/18/13  0914 History   First MD Initiated Contact with Patient 09/18/13 [phone]     Chief Complaint  Patient presents with  . Abscess  . bug bite      (Consider location/radiation/quality/duration/timing/severity/associated sxs/prior Treatment) Patient is a 33 y.o. female presenting with abscess.  Abscess Associated symptoms: no fever    patient states that she thought something bit her on her right leg 2 days ago. She states in she's had some drainage. She states there is pain. No fevers. She states it is getting a little larger. No history of MRSA. No history of abscesses. She denies possibility of pregnancy  Past Medical History  Diagnosis Date  . Herpes   . Hypertension    Past Surgical History  Procedure Laterality Date  . Tooth extraction  12-2010   Family History  Problem Relation Age of Onset  . Anesthesia problems Neg Hx   . Hypotension Neg Hx   . Malignant hyperthermia Neg Hx   . Pseudochol deficiency Neg Hx   . Other Neg Hx   . Heart disease Mother   . Vision loss Father    History  Substance Use Topics  . Smoking status: Light Tobacco Smoker -- 5 years    Last Attempt to Quit: 02/03/2011  . Smokeless tobacco: Never Used  . Alcohol Use: No   OB History   Grav Para Term Preterm Abortions TAB SAB Ect Mult Living   2 2 2  0 0 0 0 0 0 2     Review of Systems  Constitutional: Negative for fever and chills.  Skin: Positive for color change and wound.      Allergies  Review of patient's allergies indicates no known allergies.  Home Medications   Prior to Admission medications   Medication Sig Start Date End Date Taking? Authorizing Provider  medroxyPROGESTERone (DEPO-PROVERA) 150 MG/ML injection Inject 150 mg into the muscle every 3 (three) months. Next due October 26, 2013   Yes Historical Provider, MD  Multiple Vitamin (MULTIVITAMIN WITH MINERALS) TABS tablet Take 1 tablet by mouth every morning.   Yes Historical  Provider, MD   BP 133/94  Pulse 86  Temp(Src) 98.7 F (37.1 C) (Oral)  Resp 18  SpO2 100% Physical Exam  Constitutional: She appears well-developed and well-nourished.  Cardiovascular: Normal rate.   Neurological: She is alert.  Skin: There is erythema.  There is on the anterior right thigh a 5 cm tender indurated area with a central around 3 mm ulcer/pustule no fluctuance. With pressure there is purulent drainage from the center. On ultrasound there is minimal fluid.     ED Course  Procedures (including critical care time) Labs Review Labs Reviewed - No data to display  Imaging Review No results found.   EKG Interpretation None      MDM   Final diagnoses:  Abscess of right thigh    Patient was abscess of right thigh. It is more cellulitis than fluid collection. There was minimal drainage with pressure. Does not appear to need incision and drainage at this time. Single puncture with 18-gauge needle facilitated some of the drainage. Patient be given antibiotics and will followup as needed.    Juliet RudeNathan R. Rubin PayorPickering, MD 09/18/13 [phone]

## 2013-09-18 NOTE — ED Notes (Signed)
MD Pickering at bedside.  

## 2014-02-06 ENCOUNTER — Encounter (HOSPITAL_COMMUNITY): Payer: Self-pay | Admitting: Emergency Medicine

## 2014-11-05 ENCOUNTER — Encounter (HOSPITAL_COMMUNITY): Payer: Self-pay | Admitting: Emergency Medicine

## 2014-11-05 ENCOUNTER — Emergency Department (HOSPITAL_COMMUNITY)
Admission: EM | Admit: 2014-11-05 | Discharge: 2014-11-05 | Disposition: A | Discharge status: Home / Self Care | Payer: Medicaid Other | Attending: Emergency Medicine | Admitting: Emergency Medicine

## 2014-11-05 DIAGNOSIS — Z79899 Other long term (current) drug therapy: Secondary | ICD-10-CM | POA: Insufficient documentation

## 2014-11-05 DIAGNOSIS — Y92009 Unspecified place in unspecified non-institutional (private) residence as the place of occurrence of the external cause: Secondary | ICD-10-CM | POA: Insufficient documentation

## 2014-11-05 DIAGNOSIS — Z792 Long term (current) use of antibiotics: Secondary | ICD-10-CM | POA: Insufficient documentation

## 2014-11-05 DIAGNOSIS — I10 Essential (primary) hypertension: Secondary | ICD-10-CM | POA: Insufficient documentation

## 2014-11-05 DIAGNOSIS — S7011XA Contusion of right thigh, initial encounter: Secondary | ICD-10-CM | POA: Insufficient documentation

## 2014-11-05 DIAGNOSIS — Y939 Activity, unspecified: Secondary | ICD-10-CM | POA: Insufficient documentation

## 2014-11-05 DIAGNOSIS — Z720 Tobacco use: Secondary | ICD-10-CM | POA: Insufficient documentation

## 2014-11-05 DIAGNOSIS — X58XXXA Exposure to other specified factors, initial encounter: Secondary | ICD-10-CM | POA: Insufficient documentation

## 2014-11-05 DIAGNOSIS — Z8619 Personal history of other infectious and parasitic diseases: Secondary | ICD-10-CM | POA: Insufficient documentation

## 2014-11-05 DIAGNOSIS — Y999 Unspecified external cause status: Secondary | ICD-10-CM | POA: Insufficient documentation

## 2014-11-05 MED ORDER — PREDNISONE 10 MG PO TABS
ORAL_TABLET | ORAL | Status: DC
Start: 2014-11-05 — End: 2021-12-31

## 2014-11-05 MED ORDER — DEXAMETHASONE SODIUM PHOSPHATE 4 MG/ML IJ SOLN
4.0000 mg | Freq: Once | INTRAMUSCULAR | Status: AC
  Administered 2014-11-05: 4 mg via INTRAMUSCULAR
  Filled 2014-11-05: qty 1

## 2014-11-05 MED ORDER — DIPHENHYDRAMINE HCL 25 MG PO TABS: 25.0000 mg | ORAL_TABLET | Freq: Four times a day (QID) | ORAL | Status: DC

## 2014-11-05 NOTE — Discharge Instructions (Signed)
Take Benadryl as prescribed for possible reaction. Prednisone as prescribed until all gone starting tomorrow. Keep benign bruising, if worsening or spreading return to emergency department. Return to emergency department if you have any fever, chills, vomiting, new concerning symptoms.

## 2014-11-05 NOTE — ED Notes (Signed)
Pt c/o sore, painful ecchymosis to right medial thigh and right medial knee onset last night after feeling sensation of a insect bite. "It felt like a bug bit me or something, it was like a sting."

## 2014-11-05 NOTE — ED Provider Notes (Signed)
CSN: [id number]     Arrival date & time 11/05/14  1424 History  This chart was scribed for non-physician practitioner, Lottie Mussel, PA-C, working with Melene Plan, DO, by Ronney Lion, ED Scribe. This patient was seen in room WTR8/WTR8 and the patient's care was started at 3:05 PM.    No chief complaint on file.  The history is provided by the patient. No language interpreter was used.    HPI Comments: Lori Kelley is a 34 y.o. female who presents to the Emergency Department complaining of a sore, painful ecchymotic area to her right medial thigh and right medial knee that suddenly onset last night. Patient states she was outside in a dark wooded area at her friends' house. She had just sat down in a wooden chair when she felt a sudden "sting" and thought she might've been bitten by an insect. Patient immediately jumped up and ran into the house, rolled her pants up, and saw redness around the area. When she re-checked the area again this morning, the area appeared significantly more bruised. She denies any known injury.    Past Medical History  Diagnosis Date  . Herpes   . Hypertension    Past Surgical History  Procedure Laterality Date  . Tooth extraction  12-2010   Family History  Problem Relation Age of Onset  . Anesthesia problems Neg Hx   . Hypotension Neg Hx   . Malignant hyperthermia Neg Hx   . Pseudochol deficiency Neg Hx   . Other Neg Hx   . Heart disease Mother   . Vision loss Father    History  Substance Use Topics  . Smoking status: Light Tobacco Smoker -- 5 years    Last Attempt to Quit: 02/03/2011  . Smokeless tobacco: Never Used  . Alcohol Use: No   OB History    Gravida Para Term Preterm AB TAB SAB Ectopic Multiple Living   2 2 2  0 0 0 0 0 0 2     Review of Systems  Gastrointestinal: Negative for nausea and vomiting.  Musculoskeletal: Positive for myalgias.  Skin: Positive for color change (ecchymosis).  Neurological: Negative for headaches.    Allergies  Review of patient's allergies indicates no known allergies.  Home Medications   Prior to Admission medications   Medication Sig Start Date End Date Taking? Authorizing Provider  cephALEXin (KEFLEX) 500 MG capsule Take 1 capsule (500 mg total) by mouth 3 (three) times daily. 09/18/13   Benjiman Core, MD  medroxyPROGESTERone (DEPO-PROVERA) 150 MG/ML injection Inject 150 mg into the muscle every 3 (three) months. Next due October 26, 2013    Historical Provider, MD  Multiple Vitamin (MULTIVITAMIN WITH MINERALS) TABS tablet Take 1 tablet by mouth every morning.    Historical Provider, MD  oxyCODONE-acetaminophen (PERCOCET/ROXICET) 5-325 MG per tablet Take 1-2 tablets by mouth every 6 (six) hours as needed for severe pain. 09/18/13   Benjiman Core, MD  sulfamethoxazole-trimethoprim (BACTRIM DS) 800-160 MG per tablet Take 1 tablet by mouth 3 (three) times daily. 09/18/13   Benjiman Core, MD   BP 143/92 mmHg  Pulse 80  Temp(Src) 98.1 F (36.7 C) (Oral)  Resp 18  SpO2 100% Physical Exam  Constitutional: She is oriented to person, place, and time. She appears well-developed and well-nourished. No distress.  HENT:  Head: Normocephalic and atraumatic.  Eyes: Conjunctivae and EOM are normal.  Neck: Neck supple. No tracheal deviation present.  Cardiovascular: Normal rate, regular rhythm and normal heart sounds.  Pulmonary/Chest: Effort normal and breath sounds normal. No respiratory distress. She has no wheezes.  Musculoskeletal: Normal range of motion.  Neurological: She is alert and oriented to person, place, and time.  Skin: Skin is warm and dry.  Contusion noted to the medial right upper thigh, approximately 5 x 4 cm diameter. Tender to palpation. No stinger. No puncture wounds.  Psychiatric: She has a normal mood and affect. Her behavior is normal.  Nursing note and vitals reviewed.   ED Course  Procedures (including critical care time)  DIAGNOSTIC STUDIES: Oxygen  Saturation is 100% on RA, normal by my interpretation.    COORDINATION OF CARE: 3:11 PM - Discussed treatment plan with pt at bedside which includes Rx steroids and monitoring the area on her leg. Strict return precautions given, and patient instructed to return for worsening or new symptoms, including fever. Pt verbalized understanding and agreed to plan.  MDM   Final diagnoses:  Contusion of thigh, right, initial encounter   Patient with bruising to the right medial thigh after sitting down outside on the chair and immediately feeling something bite her through jeans. Area of bruising to the finding noted. Question possible snake bite, this time it has a day, she has no other systemic symptoms. There is no worsening of bruising. I do not think we need to monitor. Her vital signs are normal. I instructed her to keep eye on this bruising and if it is spreading to return to emergency department. I also discussed cellulitis return precautions. I have given her Decadron for possible allergic reaction. Home with Benadryl and prednisone.  Filed Vitals:   11/05/14 1429 11/05/14 1551  BP: 143/92 157/92  Pulse: 80 78  Temp: 98.1 F (36.7 C)   TempSrc: Oral   Resp: 18 16  SpO2: 100% 100%   I personally performed the services described in this documentation, which was scribed in my presence. The recorded information has been reviewed and is accurate.   Jaynie Crumble, PA-C 11/05/14 1554  Melene Plan, DO 11/05/14 2237

## 2015-06-01 ENCOUNTER — Encounter (HOSPITAL_COMMUNITY): Payer: Self-pay | Admitting: *Deleted

## 2015-06-01 ENCOUNTER — Emergency Department (HOSPITAL_COMMUNITY): Payer: Medicaid Other

## 2015-06-01 ENCOUNTER — Emergency Department (HOSPITAL_COMMUNITY)
Admission: EM | Admit: 2015-06-01 | Discharge: 2015-06-02 | Disposition: A | Discharge status: Home / Self Care | Payer: Medicaid Other | Attending: Emergency Medicine | Admitting: Emergency Medicine

## 2015-06-01 DIAGNOSIS — R509 Fever, unspecified: Secondary | ICD-10-CM | POA: Insufficient documentation

## 2015-06-01 DIAGNOSIS — Z79899 Other long term (current) drug therapy: Secondary | ICD-10-CM | POA: Diagnosis not present

## 2015-06-01 DIAGNOSIS — H9209 Otalgia, unspecified ear: Secondary | ICD-10-CM | POA: Insufficient documentation

## 2015-06-01 DIAGNOSIS — I10 Essential (primary) hypertension: Secondary | ICD-10-CM | POA: Insufficient documentation

## 2015-06-01 DIAGNOSIS — J029 Acute pharyngitis, unspecified: Secondary | ICD-10-CM | POA: Diagnosis not present

## 2015-06-01 DIAGNOSIS — R51 Headache: Secondary | ICD-10-CM | POA: Diagnosis not present

## 2015-06-01 DIAGNOSIS — R05 Cough: Secondary | ICD-10-CM | POA: Diagnosis not present

## 2015-06-01 DIAGNOSIS — F17200 Nicotine dependence, unspecified, uncomplicated: Secondary | ICD-10-CM | POA: Insufficient documentation

## 2015-06-01 DIAGNOSIS — Z8619 Personal history of other infectious and parasitic diseases: Secondary | ICD-10-CM | POA: Insufficient documentation

## 2015-06-01 DIAGNOSIS — R6889 Other general symptoms and signs: Secondary | ICD-10-CM

## 2015-06-01 DIAGNOSIS — R0981 Nasal congestion: Secondary | ICD-10-CM | POA: Insufficient documentation

## 2015-06-01 MED ORDER — ACETAMINOPHEN 325 MG PO TABS
650.0000 mg | ORAL_TABLET | Freq: Once | ORAL | Status: AC
  Administered 2015-06-01: 650 mg via ORAL
  Filled 2015-06-01: qty 2

## 2015-06-01 MED ORDER — KETOROLAC TROMETHAMINE 60 MG/2ML IM SOLN
60.0000 mg | Freq: Once | INTRAMUSCULAR | Status: AC
  Administered 2015-06-01: 60 mg via INTRAMUSCULAR
  Filled 2015-06-01: qty 2

## 2015-06-01 NOTE — ED Notes (Signed)
Pt states that she has been feeling bad since Tuesday and has had a fever as high as 102,  She has taken motrin and tylenol and she says her back her,  Her nose is draining, she has a headache and just feel "bad"

## 2015-06-02 MED ORDER — GUAIFENESIN-DM 100-10 MG/5ML PO SYRP: 5.0000 mL | ORAL_SOLUTION | ORAL | Status: DC | PRN

## 2015-06-02 MED ORDER — PSEUDOEPHEDRINE HCL 30 MG PO TABS: 30.0000 mg | ORAL_TABLET | ORAL | Status: DC | PRN

## 2015-06-02 MED ORDER — IBUPROFEN 600 MG PO TABS: 600.0000 mg | ORAL_TABLET | Freq: Four times a day (QID) | ORAL | Status: DC | PRN

## 2015-06-02 NOTE — Discharge Instructions (Signed)
Take ibuprofen and Tylenol for headache, body aches, fever. Patient to drink plenty of fluids. Eat well-balanced meals. Take Robitussin-DM for cough, Sudafed for congestion. Follow-up with primary care doctor for recheck.  Influenza, Adult Influenza ("the flu") is a viral infection of the respiratory tract. It occurs more often in winter months because people spend more time in close contact with one another. Influenza can make you feel very sick. Influenza easily spreads from person to person (contagious). CAUSES  Influenza is caused by a virus that infects the respiratory tract. You can catch the virus by breathing in droplets from an infected person's cough or sneeze. You can also catch the virus by touching something that was recently contaminated with the virus and then touching your mouth, nose, or eyes. RISKS AND COMPLICATIONS You may be at risk for a more severe case of influenza if you smoke cigarettes, have diabetes, have chronic heart disease (such as heart failure) or lung disease (such as asthma), or if you have a weakened immune system. Elderly people and pregnant women are also at risk for more serious infections. The most common problem of influenza is a lung infection (pneumonia). Sometimes, this problem can require emergency medical care and may be life threatening. SIGNS AND SYMPTOMS  Symptoms typically last 4 to 10 days and may include:  Fever.  Chills.  Headache, body aches, and muscle aches.  Sore throat.  Chest discomfort and cough.  Poor appetite.  Weakness or feeling tired.  Dizziness.  Nausea or vomiting. DIAGNOSIS  Diagnosis of influenza is often made based on your history and a physical exam. A nose or throat swab test can be done to confirm the diagnosis. TREATMENT  In mild cases, influenza goes away on its own. Treatment is directed at relieving symptoms. For more severe cases, your health care provider may prescribe antiviral medicines to shorten the  sickness. Antibiotic medicines are not effective because the infection is caused by a virus, not by bacteria. HOME CARE INSTRUCTIONS  Take medicines only as directed by your health care provider.  Use a cool mist humidifier to make breathing easier.  Get plenty of rest until your temperature returns to normal. This usually takes 3 to 4 days.  Drink enough fluid to keep your urine clear or pale yellow.  Cover yourmouth and nosewhen coughing or sneezing,and wash your handswellto prevent thevirusfrom spreading.  Stay homefromwork orschool untilthe fever is gonefor at least 68full day. PREVENTION  An annual influenza vaccination (flu shot) is the best way to avoid getting influenza. An annual flu shot is now routinely recommended for all adults in the [place] SEEK MEDICAL CARE IF:  You experiencechest pain, yourcough worsens,or you producemore mucus.  Youhave nausea,vomiting, ordiarrhea.  Your fever returns or gets worse. SEEK IMMEDIATE MEDICAL CARE IF:  You havetrouble breathing, you become short of breath,or your skin ornails becomebluish.  You have severe painor stiffnessin the neck.  You develop a sudden headache, or pain in the face or ear.  You have nausea or vomiting that you cannot control. MAKE SURE YOU:   Understand these instructions.  Will watch your condition.  Will get help right away if you are not doing well or get worse.   This information is not intended to replace advice given to you by your health care provider. Make sure you discuss any questions you have with your health care provider.   Document Released: 03/21/2000 Document Revised: 04/14/2014 Document Reviewed: 06/23/2011 Elsevier Interactive Patient Education [employer]

## 2015-06-02 NOTE — ED Provider Notes (Signed)
CSN: [id number]     Arrival date & time 06/01/15  1924 History   First MD Initiated Contact with Patient 06/01/15 2301     Chief Complaint  Patient presents with  . Fever  . Cough  . Headache     (Consider location/radiation/quality/duration/timing/severity/associated sxs/prior Treatment) HPI Lori Kelley is a 35 y.o. female history of hypertension, presents to emergency department complaining of fever, chills, body aches, nasal congestion, sore throat, cough. She states symptoms started 4 days ago. She states "I just feel bad." She states that she does not have any contact with any recent ill family members but states she works at [employer]. Patient states she has taking Tylenol and Motrin but it has not helped with her body aches. She denies any nausea or vomiting. No diarrhea. Although she reports headache and body aches, she denies any neck stiffness. Denies any photophobia. She has no other complaints.  Past Medical History  Diagnosis Date  . Herpes   . Hypertension    Past Surgical History  Procedure Laterality Date  . Tooth extraction  12-2010   Family History  Problem Relation Age of Onset  . Anesthesia problems Neg Hx   . Hypotension Neg Hx   . Malignant hyperthermia Neg Hx   . Pseudochol deficiency Neg Hx   . Other Neg Hx   . Heart disease Mother   . Vision loss Father    Social History  Substance Use Topics  . Smoking status: Light Tobacco Smoker -- 5 years    Last Attempt to Quit: 02/03/2011  . Smokeless tobacco: Never Used  . Alcohol Use: No   OB History    Gravida Para Term Preterm AB TAB SAB Ectopic Multiple Living   0 0 0 0 0 0 2     Review of Systems  Constitutional: Positive for fever and chills.  HENT: Positive for ear pain and sore throat.   Respiratory: Positive for cough. Negative for chest tightness and shortness of breath.   Cardiovascular: Negative.  Negative for chest pain, palpitations and leg swelling.  Gastrointestinal:  Negative for nausea, vomiting, abdominal pain and diarrhea.  Genitourinary: Negative for dysuria, flank pain and pelvic pain.  Musculoskeletal: Negative for myalgias, arthralgias, neck pain and neck stiffness.  Skin: Negative for rash.  Neurological: Positive for headaches. Negative for dizziness, weakness and numbness.  All other systems reviewed and are negative.     Allergies  Review of patient's allergies indicates no known allergies.  Home Medications   Prior to Admission medications   Medication Sig Start Date End Date Taking? Authorizing Provider  chlorhexidine (PERIDEX) 0.12 % solution RINSE WITH FOR 30 SECONDS IN THE AM AND PM FOR 2 WEEKS, BEGIN AFTER 24 HRS 05/15/15  Yes Historical Provider, MD  gabapentin (NEURONTIN) 100 MG capsule Take 200 mg by mouth 3 (three) times daily. 04/11/15  Yes Historical Provider, MD  hydrochlorothiazide (HYDRODIURIL) 25 MG tablet Take 25 mg by mouth every morning. 05/12/15  Yes Historical Provider, MD  HYDROcodone-acetaminophen (NORCO/VICODIN) 5-325 MG tablet Take 1 tablet by mouth every 6 (six) hours as needed. for pain 05/15/15  Yes Historical Provider, MD  ibuprofen (ADVIL,MOTRIN) 600 MG tablet TAKE 1 TABLET EVERY 6 HOURS (MAXC  PER DAY) 05/15/15  Yes Historical Provider, MD  medroxyPROGESTERone (DEPO-PROVERA) 150 MG/ML injection Inject 150 mg into the muscle every 3 (three) months. Next due 06/25/2015   Yes Historical Provider, MD  Multiple Vitamin (MULTIVITAMIN WITH MINERALS) TABS tablet Take  1 tablet by mouth every morning.   Yes Historical Provider, MD  cephALEXin (KEFLEX) 500 MG capsule Take 1 capsule (500 mg total) by mouth 3 (three) times daily. Patient not taking: Reported on 06/01/2015 09/18/13   Benjiman Core, MD  diphenhydrAMINE (BENADRYL) 25 MG tablet Take 1 tablet (25 mg total) by mouth every 6 (six) hours. Patient not taking: Reported on 06/01/2015 11/05/14   Jaynie Crumble, PA-C  oxyCODONE-acetaminophen (PERCOCET/ROXICET)  5-325 MG per tablet Take 1-2 tablets by mouth every 6 (six) hours as needed for severe pain. Patient not taking: Reported on 06/01/2015 09/18/13   Benjiman Core, MD  predniSONE (DELTASONE) 10 MG tablet Take 5 tab day 1, take 4 tab day 2, take 3 tab day 3, take 2 tab day 4, and take 1 tab day 5 Patient not taking: Reported on 06/01/2015 11/05/14   Onesti Bonfiglio, PA-C  sulfamethoxazole-trimethoprim (BACTRIM DS) 800-160 MG per tablet Take 1 tablet by mouth 3 (three) times daily. Patient not taking: Reported on 06/01/2015 09/18/13   Benjiman Core, MD   BP 120/73 mmHg  Pulse 83  Temp(Src) 99.4 F (37.4 C) (Oral)  Resp 20  SpO2 100% Physical Exam  Constitutional: She is oriented to person, place, and time. She appears well-developed and well-nourished. No distress.  HENT:  Head: Normocephalic.  Right Ear: Tympanic membrane, external ear and ear canal normal.  Left Ear: Tympanic membrane, external ear and ear canal normal.  Nose: Nose normal. No mucosal edema or rhinorrhea.  Mouth/Throat: Uvula is midline and mucous membranes are normal. Posterior oropharyngeal erythema present. No oropharyngeal exudate, posterior oropharyngeal edema or tonsillar abscesses.  Eyes: Conjunctivae are normal.  Neck: Neck supple.  Cardiovascular: Normal rate, regular rhythm and normal heart sounds.   Pulmonary/Chest: Effort normal and breath sounds normal. No respiratory distress. She has no wheezes. She has no rales.  Abdominal: Soft. Bowel sounds are normal. She exhibits no distension. There is no tenderness. There is no rebound.  Musculoskeletal: She exhibits no edema.  Neurological: She is alert and oriented to person, place, and time.  Skin: Skin is warm and dry.  Psychiatric: She has a normal mood and affect. Her behavior is normal.  Nursing note and vitals reviewed.   ED Course  Procedures (including critical care time) Labs Review Labs Reviewed - No data to display  Imaging Review Dg Chest 2  View  06/01/2015  CLINICAL DATA:  Acute onset of fever and nasal drainage. Headache. Initial encounter. EXAM: CHEST  2 VIEW COMPARISON:  Chest radiograph from 03/30/2012 FINDINGS: The lungs are well-aerated and clear. There is no evidence of focal opacification, pleural effusion or pneumothorax. The heart is normal in size; the mediastinal contour is within normal limits. No acute osseous abnormalities are seen. IMPRESSION: No acute cardiopulmonary process seen. Electronically Signed   By: Roanna Raider M.D.   On: 06/01/2015 23:35   I have personally reviewed and evaluated these images and lab results as part of my medical decision-making.   EKG Interpretation None      MDM   Final diagnoses:  Flu-like symptoms    Patient with flulike symptoms, reports severe cough. Will get chest x-ray to rule out pneumonia. Symptoms for 4 days, outside of Tamiflu window. Patient's vital signs are normal in emergency department. I administered Toradol and Tylenol for her symptoms.  Chest x-ray is negative. Again most likely a viral URI versus influenza. Will discharge home with Robitussin, Tylenol, Motrin, Sudafed. Follow-up with primary care doctor.  Filed Vitals:  06/01/15 2017 06/01/15 2359  BP: 137/98 120/73  Pulse: 90 83  Temp: 100 F (37.8 C) 99.4 F (37.4 C)  TempSrc: Oral Oral  Resp: 20 20  SpO2: 99% 100%       Jaynie Crumble, PA-C 06/02/15 0046  Derwood Kaplan, MD 06/02/15 1537

## 2019-03-29 ENCOUNTER — Other Ambulatory Visit: Payer: Self-pay | Admitting: Obstetrics & Gynecology

## 2019-03-29 DIAGNOSIS — Z1231 Encounter for screening mammogram for malignant neoplasm of breast: Secondary | ICD-10-CM

## 2019-04-11 ENCOUNTER — Other Ambulatory Visit: Payer: Self-pay

## 2019-04-11 ENCOUNTER — Encounter (HOSPITAL_COMMUNITY): Payer: Self-pay | Admitting: Emergency Medicine

## 2019-04-11 ENCOUNTER — Emergency Department (HOSPITAL_COMMUNITY): Payer: Medicaid Other

## 2019-04-11 DIAGNOSIS — R0789 Other chest pain: Secondary | ICD-10-CM | POA: Insufficient documentation

## 2019-04-11 DIAGNOSIS — E876 Hypokalemia: Secondary | ICD-10-CM | POA: Insufficient documentation

## 2019-04-11 DIAGNOSIS — I10 Essential (primary) hypertension: Secondary | ICD-10-CM | POA: Insufficient documentation

## 2019-04-11 DIAGNOSIS — R0602 Shortness of breath: Secondary | ICD-10-CM | POA: Insufficient documentation

## 2019-04-11 DIAGNOSIS — F17210 Nicotine dependence, cigarettes, uncomplicated: Secondary | ICD-10-CM | POA: Insufficient documentation

## 2019-04-11 DIAGNOSIS — Z79899 Other long term (current) drug therapy: Secondary | ICD-10-CM | POA: Insufficient documentation

## 2019-04-11 LAB — CBC
HCT: 41.5 % (ref 36.0–46.0)
Hemoglobin: 13.6 g/dL (ref 12.0–15.0)
MCH: 28 pg (ref 26.0–34.0)
MCHC: 32.8 g/dL (ref 30.0–36.0)
MCV: 85.4 fL (ref 80.0–100.0)
Platelets: 334 10*3/uL (ref 150–400)
RBC: 4.86 MIL/uL (ref 3.87–5.11)
RDW: 13.2 % (ref 11.5–15.5)
WBC: 9 10*3/uL (ref 4.0–10.5)
nRBC: 0 % (ref 0.0–0.2)

## 2019-04-11 LAB — I-STAT BETA HCG BLOOD, ED (NOT ORDERABLE): I-stat hCG, quantitative: <5 m[IU]/mL (ref <5)

## 2019-04-11 LAB — BASIC METABOLIC PANEL
Anion gap: 9 (ref 5–15)
BUN: 8 mg/dL (ref 6–20)
CO2: 27 mmol/L (ref 22–32)
Calcium: 9.6 mg/dL (ref 8.9–10.3)
Chloride: 102 mmol/L (ref 98–111)
Creatinine, Ser: 0.86 mg/dL (ref 0.44–1.00)
GFR calc Af Amer: >60 mL/min (ref >60)
GFR calc non Af Amer: >60 mL/min (ref >60)
Glucose, Bld: 103 mg/dL — ABNORMAL HIGH (ref 70–99)
Potassium: 3.3 mmol/L — ABNORMAL LOW (ref 3.5–5.1)
Sodium: 138 mmol/L (ref 135–145)

## 2019-04-11 LAB — TROPONIN I (HIGH SENSITIVITY): Troponin I (High Sensitivity): <2 ng/L (ref <18)

## 2019-04-11 NOTE — ED Triage Notes (Signed)
Pt reports central chest pains and SOB x 4 days after she found out her sister was in hospital. Unsure if related to stress.

## 2019-04-12 ENCOUNTER — Emergency Department (HOSPITAL_COMMUNITY)
Admission: EM | Admit: 2019-04-12 | Discharge: 2019-04-12 | Disposition: A | Discharge status: Home / Self Care | Payer: Medicaid Other | Attending: Emergency Medicine | Admitting: Emergency Medicine

## 2019-04-12 DIAGNOSIS — E876 Hypokalemia: Secondary | ICD-10-CM

## 2019-04-12 DIAGNOSIS — R0789 Other chest pain: Secondary | ICD-10-CM

## 2019-04-12 LAB — TROPONIN I (HIGH SENSITIVITY): Troponin I (High Sensitivity): 3 ng/L (ref <18)

## 2019-04-12 LAB — D-DIMER, QUANTITATIVE: D-Dimer, Quant: 0.39 ug/mL-FEU (ref 0.00–0.50)

## 2019-04-12 MED ORDER — KETOROLAC TROMETHAMINE 30 MG/ML IJ SOLN
15.0000 mg | Freq: Once | INTRAMUSCULAR | Status: AC
  Administered 2019-04-12: 15 mg via INTRAVENOUS
  Filled 2019-04-12: qty 1

## 2019-04-12 MED ORDER — POTASSIUM CHLORIDE CRYS ER 20 MEQ PO TBCR: 20.0000 meq | EXTENDED_RELEASE_TABLET | Freq: Two times a day (BID) | ORAL | 0 refills | Status: DC

## 2019-04-12 MED ORDER — NAPROXEN 500 MG PO TABS: ORAL_TABLET | ORAL | 0 refills | Status: DC

## 2019-04-12 MED ORDER — CYCLOBENZAPRINE HCL 5 MG PO TABS: 5.0000 mg | ORAL_TABLET | Freq: Three times a day (TID) | ORAL | 0 refills | Status: DC | PRN

## 2019-04-12 NOTE — ED Provider Notes (Signed)
[facility]-EMERGENCY DEPT Provider Note   CSN: [id number] Arrival date & time: 04/11/19  1146   Time seen 12:50 AM  History Chief Complaint  Patient presents with  . Chest Pain  . Shortness of Breath    Lori Kelley is a 39 y.o. female.  HPI patient reports she has been under a lot of stress the past few days.  Her sister was in the hospital and had a stent placed however before she was discharged she "stopped breathing" and is now in the ICU on a ventilator.  Patient states she sat in the waiting room from 2 PM to 1 AM and still was not able to see her sister.  She is also working and is taking care of her children plus her sisters adult children.  She feels like she has the weight of the world on her.  She states her heart feels heavy and she has a constant aching heaviness and at times she gets a sharp pain that lasts a few seconds.  She states she feels short of breath when she lays down and she gets scared.  She states she sits up to breathe.  She denies cough and thinks maybe she is having wheezing all she though she is never had it before.  She does smoke and she has cut down to 8 cigarettes a day.  There is a history of reactive airway disease in the family.  She denies fever, sore throat, or rhinorrhea.  She states that her chest discomfort feels better if she takes big deep breaths.  She is never had this before.  Patient states she takes Depo-Provera.  PCP [facility]    Past Medical History:  Diagnosis Date  . Herpes   . Hypertension     There are no problems to display for this patient.   Past Surgical History:  Procedure Laterality Date  . TOOTH EXTRACTION  12-2010     OB History    Gravida  2   Para  2   Term  2   Preterm  0   AB  0   Living  2     SAB  0   TAB  0   Ectopic  0   Multiple  0   Live Births  2           Family History  Problem Relation Age of Onset  . Heart disease Mother   . Vision loss Father   .  Anesthesia problems Neg Hx   . Hypotension Neg Hx   . Malignant hyperthermia Neg Hx   . Pseudochol deficiency Neg Hx   . Other Neg Hx     Social History   Tobacco Use  . Smoking status: Light Tobacco Smoker    Years: 5.00    Last attempt to quit: 02/03/2011    Years since quitting: 8.1  . Smokeless tobacco: Never Used  Substance Use Topics  . Alcohol use: No  . Drug use: No  employed as home health aide  Home Medications Prior to Admission medications   Medication Sig Start Date End Date Taking? Authorizing Provider  Ascorbic Acid (VITAMIN C) 1000 MG tablet Take 1,000 mg by mouth daily.   Yes [provider]  cholecalciferol (VITAMIN D3) 25 MCG (1000 UT) tablet Take 1,000 Units by mouth daily.   Yes [provider]  hydrochlorothiazide (HYDRODIURIL) 25 MG tablet Take 25 mg by mouth every morning. 05/12/15  Yes [provider]  medroxyPROGESTERone (DEPO-PROVERA) 150 MG/ML injection Inject 150 mg into the muscle every 3 (three) months. Next due 06/25/2015   Yes [provider]  Multiple Vitamin (MULTIVITAMIN WITH MINERALS) TABS tablet Take 1 tablet by mouth every morning.   Yes [provider]  cephALEXin (KEFLEX) 500 MG capsule Take 1 capsule (500 mg total) by mouth 3 (three) times daily. Patient not taking: Reported on 06/01/2015 09/18/13   Benjiman Core, MD  cyclobenzaprine (FLEXERIL) 5 MG tablet Take 1 tablet (5 mg total) by mouth 3 (three) times daily as needed (muscle soreness). 04/12/19   Devoria Albe, MD  diphenhydrAMINE (BENADRYL) 25 MG tablet Take 1 tablet (25 mg total) by mouth every 6 (six) hours. Patient not taking: Reported on 06/01/2015 11/05/14   Jaynie Crumble, PA-C  guaiFENesin-dextromethorphan (ROBITUSSIN DM) 100-10 MG/5ML syrup Take 5 mLs by mouth every 4 (four) hours as needed for cough. Patient not taking: Reported on 04/12/2019 06/02/15   Jaynie Crumble, PA-C  ibuprofen (ADVIL,MOTRIN) 600 MG tablet Take 1 tablet  (600 mg total) by mouth every 6 (six) hours as needed. Patient not taking: Reported on 04/12/2019 06/02/15   Jaynie Crumble, PA-C  naproxen (NAPROSYN) 500 MG tablet Take 1 po BID with food prn pain 04/12/19   Devoria Albe, MD  oxyCODONE-acetaminophen (PERCOCET/ROXICET) 5-325 MG per tablet Take 1-2 tablets by mouth every 6 (six) hours as needed for severe pain. Patient not taking: Reported on 06/01/2015 09/18/13   Benjiman Core, MD  potassium chloride SA (KLOR-CON) 20 MEQ tablet Take 1 tablet (20 mEq total) by mouth 2 (two) times daily. 04/12/19   Devoria Albe, MD  predniSONE (DELTASONE) 10 MG tablet Take 5 tab day 1, take 4 tab day 2, take 3 tab day 3, take 2 tab day 4, and take 1 tab day 5 Patient not taking: Reported on 06/01/2015 11/05/14   Jaynie Crumble, PA-C  pseudoephedrine (SUDAFED) 30 MG tablet Take 1 tablet (30 mg total) by mouth every 4 (four) hours as needed for congestion. Patient not taking: Reported on 04/12/2019 06/02/15   Jaynie Crumble, PA-C  sulfamethoxazole-trimethoprim (BACTRIM DS) 800-160 MG per tablet Take 1 tablet by mouth 3 (three) times daily. Patient not taking: Reported on 06/01/2015 09/18/13   Benjiman Core, MD    Allergies    Patient has no known allergies.  Review of Systems   Review of Systems  All other systems reviewed and are negative.   Physical Exam Updated Vital Signs BP 113/71   Pulse 73   Temp 98.3 F (36.8 C) (Oral)   Resp (!) 29   Ht 5\' 6"  (1.676 m)   Wt 88.9 kg   SpO2 97%   BMI 31.64 kg/m   Physical Exam Vitals and nursing note reviewed.  Constitutional:      General: She is not in acute distress.    Appearance: Normal appearance. She is well-developed. She is not ill-appearing or toxic-appearing.  HENT:     Head: Normocephalic and atraumatic.     Right Ear: External ear normal.     Left Ear: External ear normal.     Nose: Nose normal. No mucosal edema or rhinorrhea.     Mouth/Throat:     Mouth: Mucous membranes are moist.      Dentition: No dental abscesses.     Pharynx: No uvula swelling.  Eyes:     Extraocular Movements: Extraocular movements intact.     Conjunctiva/sclera: Conjunctivae normal.     Pupils: Pupils are equal, round, and reactive to light.  Cardiovascular:     Rate and Rhythm: Normal rate and regular rhythm.     Heart sounds: Normal heart sounds. No murmur. No friction rub. No gallop.   Pulmonary:     Effort: Pulmonary effort is normal. No respiratory distress.     Breath sounds: Normal breath sounds. No wheezing, rhonchi or rales.  Chest:     Chest wall: Tenderness present. No crepitus.       Comments: Area of pain noted, patient is also very tender to the left upper costochondral junctions and the left upper chest wall. Abdominal:     Palpations: Abdomen is soft.  Musculoskeletal:        General: No swelling or tenderness. Normal range of motion.     Cervical back: Full passive range of motion without pain, normal range of motion and neck supple.     Comments: Moves all extremities well.   Skin:    General: Skin is warm and dry.     Coloration: Skin is not pale.     Findings: No erythema or rash.  Neurological:     General: No focal deficit present.     Mental Status: She is alert and oriented to person, place, and time.     Cranial Nerves: No cranial nerve deficit.  Psychiatric:        Mood and Affect: Mood normal. Mood is not anxious.        Speech: Speech normal.        Behavior: Behavior normal.        Thought Content: Thought content normal.     ED Results / Procedures / Treatments   Labs (all labs ordered are listed, but only abnormal results are displayed) Results for orders placed or performed during the hospital encounter of 94/70/96  Basic metabolic panel  Result Value Ref Range   Sodium 138 135 - 145 mmol/L   Potassium 3.3 (L) 3.5 - 5.1 mmol/L   Chloride 102 98 - 111 mmol/L   CO2 27 22 - 32 mmol/L   Glucose, Bld 103 (H) 70 - 99 mg/dL   BUN 8 6 - 20 mg/dL    Creatinine, Ser 0.86 0.44 - 1.00 mg/dL   Calcium 9.6 8.9 - 10.3 mg/dL   GFR calc non Af Amer >60 >60 mL/min   GFR calc Af Amer >60 >60 mL/min   Anion gap 9 5 - 15  CBC  Result Value Ref Range   WBC 9.0 4.0 - 10.5 K/uL   RBC 4.86 3.87 - 5.11 MIL/uL   Hemoglobin 13.6 12.0 - 15.0 g/dL   HCT 41.5 36.0 - 46.0 %   MCV 85.4 80.0 - 100.0 fL   MCH 28.0 26.0 - 34.0 pg   MCHC 32.8 30.0 - 36.0 g/dL   RDW 13.2 11.5 - 15.5 %   Platelets 334 150 - 400 K/uL   nRBC 0.0 0.0 - 0.2 %  D-dimer, quantitative  Result Value Ref Range   D-Dimer, Quant 0.39 0.00 - 0.50 ug/mL-FEU  I-Stat beta hCG blood, ED  Result Value Ref Range   I-stat hCG, quantitative <5.0 <5 mIU/mL   Comment 3          Troponin I (High Sensitivity)  Result Value Ref Range   Troponin I (High Sensitivity) <2 <18 ng/L  Troponin I (High Sensitivity)  Result Value Ref Range   Troponin I (High Sensitivity) 3 <18 ng/L   Laboratory interpretation all normal except hypokalemia    EKG EKG Interpretation  Date/Time:  Monday April 11 2019 12:02:51 EST Ventricular Rate:  112 PR Interval:  142 QRS Duration: 86 QT Interval:  318 QTC Calculation: 434 R Axis:   72 Text Interpretation: Sinus tachycardia Nonspecific ST abnormality Since last tracing rate faster 30 Mar 2012 Confirmed by Devoria Albe ([id number]) on 04/11/2019 11:13:10 PM   Radiology DG Chest 2 View  Result Date: 04/11/2019 CLINICAL DATA:  Chest pain. Additional history provided: Shortness of breath and some sternal 2 right-sided chest pain for 1 week. EXAM: CHEST - 2 VIEW COMPARISON:  Chest radiograph 06/02/2015 FINDINGS: Heart size within normal limits. No airspace consolidation within the lungs. No evidence of pleural effusion or pneumothorax. No acute bony abnormality. IMPRESSION: No evidence of acute cardiopulmonary abnormality. Electronically Signed   By: Jackey Loge DO   On: 04/11/2019 12:41    Procedures Procedures (including critical care time)  Medications Ordered  in ED Medications  ketorolac (TORADOL) 30 MG/ML injection 15 mg (15 mg Intravenous Given 04/12/19 0127)    ED Course  I have reviewed the triage vital signs and the nursing notes.  Pertinent labs & imaging results that were available during my care of the patient were reviewed by me and considered in my medical decision making (see chart for details).    MDM Rules/Calculators/A&P                      Patient was given Toradol for her chest wall pain.  A delta troponin was done, since she is on Depo-Provera a D-dimer was done.  Recheck at 3:35 AM patient states her pain is improved with the IV Toradol.  Her delta troponin is negative and her D-dimer is also negative so we discussed discharge instructions.  She was sent home with anti-inflammatory and  muscle relaxer and she should use ice and heat for comfort and try to destress herself   Final Clinical Impression(s) / ED Diagnoses Final diagnoses:  Chest wall pain  Hypokalemia    Rx / DC Orders ED Discharge Orders         Ordered    potassium chloride SA (KLOR-CON) 20 MEQ tablet  2 times daily     04/12/19 0341    naproxen (NAPROSYN) 500 MG tablet     04/12/19 0341    cyclobenzaprine (FLEXERIL) 5 MG tablet  3 times daily PRN     04/12/19 0341          Plan discharge  Devoria Albe, MD, Concha Pyo, MD 04/12/19 [phone]

## 2019-04-12 NOTE — ED Notes (Signed)
ED Provider at bedside. 

## 2019-04-12 NOTE — Discharge Instructions (Addendum)
Use ice and heat for comfort.  Take the medications as prescribed.  Take the potassium pills until gone.  Look at the foods that are high in potassium and try to eat those.  Recheck if you seem worse, such as fever, struggling to breathe, coughing.  If you continue to have chest pain you should be evaluated by your primary care doctor.

## 2020-02-18 ENCOUNTER — Other Ambulatory Visit: Payer: Self-pay

## 2020-02-18 ENCOUNTER — Emergency Department (HOSPITAL_COMMUNITY)
Admission: EM | Admit: 2020-02-18 | Discharge: 2020-02-18 | Disposition: A | Discharge status: Home / Self Care | Payer: Medicaid Other | Attending: Emergency Medicine | Admitting: Emergency Medicine

## 2020-02-18 DIAGNOSIS — F17200 Nicotine dependence, unspecified, uncomplicated: Secondary | ICD-10-CM | POA: Insufficient documentation

## 2020-02-18 DIAGNOSIS — I10 Essential (primary) hypertension: Secondary | ICD-10-CM | POA: Insufficient documentation

## 2020-02-18 DIAGNOSIS — Z79899 Other long term (current) drug therapy: Secondary | ICD-10-CM | POA: Insufficient documentation

## 2020-02-18 DIAGNOSIS — L0201 Cutaneous abscess of face: Secondary | ICD-10-CM | POA: Insufficient documentation

## 2020-02-18 MED ORDER — LIDOCAINE HCL (PF) 1 % IJ SOLN
5.0000 mL | Freq: Once | INTRAMUSCULAR | Status: AC
  Administered 2020-02-18: 5 mL
  Filled 2020-02-18: qty 30

## 2020-02-18 NOTE — ED Triage Notes (Signed)
Patient has an abscess on right side of face for about 2 months, and has been getting worse over the last week. Patient states it is painful  PCP did video call with patient and told her to be seen in ED

## 2020-02-18 NOTE — ED Provider Notes (Signed)
[facility]-EMERGENCY DEPT Provider Note   CSN: [id number] Arrival date & time: 02/18/20  1433     History Chief Complaint  Patient presents with  . Cyst    Lori Kelley is a 39 y.o. female.  Presents to ER with concern for abscess or cyst on her right face.  Has noticed it for about 2 months, worsening over the last couple weeks.  Says that she did a virtual visit and was recommended to come to ER for assessment.  Has tried warm compresses with seems to help with pain but not swelling.  No fevers.  History of hypertension, denies other medical problems.  HPI     Past Medical History:  Diagnosis Date  . Herpes   . Hypertension     There are no problems to display for this patient.   Past Surgical History:  Procedure Laterality Date  . TOOTH EXTRACTION  12-2010     OB History    Gravida  2   Para  2   Term  2   Preterm  0   AB  0   Living  2     SAB  0   TAB  0   Ectopic  0   Multiple  0   Live Births  2           Family History  Problem Relation Age of Onset  . Heart disease Mother   . Vision loss Father   . Anesthesia problems Neg Hx   . Hypotension Neg Hx   . Malignant hyperthermia Neg Hx   . Pseudochol deficiency Neg Hx   . Other Neg Hx     Social History   Tobacco Use  . Smoking status: Light Tobacco Smoker    Years: 5.00    Last attempt to quit: 02/03/2011    Years since quitting: 9.0  . Smokeless tobacco: Never Used  Substance Use Topics  . Alcohol use: No  . Drug use: No    Home Medications Prior to Admission medications   Medication Sig Start Date End Date Taking? Authorizing Provider  Ascorbic Acid (VITAMIN C) 1000 MG tablet Take 1,000 mg by mouth daily.    [provider]  cephALEXin (KEFLEX) 500 MG capsule Take 1 capsule (500 mg total) by mouth 3 (three) times daily. Patient not taking: Reported on 06/01/2015 09/18/13   Benjiman Core, MD  cholecalciferol (VITAMIN D3) 25 MCG  (1000 UT) tablet Take 1,000 Units by mouth daily.    [provider]  cyclobenzaprine (FLEXERIL) 5 MG tablet Take 1 tablet (5 mg total) by mouth 3 (three) times daily as needed (muscle soreness). 04/12/19   Devoria Albe, MD  diphenhydrAMINE (BENADRYL) 25 MG tablet Take 1 tablet (25 mg total) by mouth every 6 (six) hours. Patient not taking: Reported on 06/01/2015 11/05/14   Jaynie Crumble, PA-C  guaiFENesin-dextromethorphan (ROBITUSSIN DM) 100-10 MG/5ML syrup Take 5 mLs by mouth every 4 (four) hours as needed for cough. Patient not taking: Reported on 04/12/2019 06/02/15   Jaynie Crumble, PA-C  hydrochlorothiazide (HYDRODIURIL) 25 MG tablet Take 25 mg by mouth every morning. 05/12/15   [provider]  ibuprofen (ADVIL,MOTRIN) 600 MG tablet Take 1 tablet (600 mg total) by mouth every 6 (six) hours as needed. Patient not taking: Reported on 04/12/2019 06/02/15   Jaynie Crumble, PA-C  medroxyPROGESTERone (DEPO-PROVERA) 150 MG/ML injection Inject 150 mg into the muscle every 3 (three) months. Next due 06/25/2015    [provider]  Multiple Vitamin (MULTIVITAMIN WITH MINERALS) TABS tablet Take 1 tablet by mouth every morning.    [provider]  naproxen (NAPROSYN) 500 MG tablet Take 1 po BID with food prn pain 04/12/19   Devoria Albe, MD  oxyCODONE-acetaminophen (PERCOCET/ROXICET) 5-325 MG per tablet Take 1-2 tablets by mouth every 6 (six) hours as needed for severe pain. Patient not taking: Reported on 06/01/2015 09/18/13   Benjiman Core, MD  potassium chloride SA (KLOR-CON) 20 MEQ tablet Take 1 tablet (20 mEq total) by mouth 2 (two) times daily. 04/12/19   Devoria Albe, MD  predniSONE (DELTASONE) 10 MG tablet Take 5 tab day 1, take 4 tab day 2, take 3 tab day 3, take 2 tab day 4, and take 1 tab day 5 Patient not taking: Reported on 06/01/2015 11/05/14   Jaynie Crumble, PA-C  pseudoephedrine (SUDAFED) 30 MG tablet Take 1 tablet (30 mg total) by mouth every 4 (four)  hours as needed for congestion. Patient not taking: Reported on 04/12/2019 06/02/15   Jaynie Crumble, PA-C  sulfamethoxazole-trimethoprim (BACTRIM DS) 800-160 MG per tablet Take 1 tablet by mouth 3 (three) times daily. Patient not taking: Reported on 06/01/2015 09/18/13   Benjiman Core, MD    Allergies    Patient has no known allergies.  Review of Systems   Review of Systems  Constitutional: Negative for chills and fever.  HENT: Negative for ear pain and sore throat.   Eyes: Negative for pain and visual disturbance.  Respiratory: Negative for cough and shortness of breath.   Cardiovascular: Negative for chest pain and palpitations.  Gastrointestinal: Negative for abdominal pain and vomiting.  Genitourinary: Negative for dysuria and hematuria.  Musculoskeletal: Negative for arthralgias and back pain.  Skin: Negative for color change and rash.  Neurological: Negative for seizures and syncope.  All other systems reviewed and are negative.   Physical Exam Updated Vital Signs BP (!) 138/93 (BP Location: Left Arm)   Pulse 74   Temp 98.9 F (37.2 C) (Oral)   Resp 18   SpO2 100%   Physical Exam Vitals and nursing note reviewed.  Constitutional:      General: She is not in acute distress.    Appearance: She is well-developed.  HENT:     Head: Normocephalic.     Comments: Right lower face there is a 1 cm diameter area of swelling, induration, no significant overlying erythema, fluctuance noted Eyes:     Conjunctiva/sclera: Conjunctivae normal.  Cardiovascular:     Rate and Rhythm: Normal rate.     Pulses: Normal pulses.  Pulmonary:     Effort: Pulmonary effort is normal. No respiratory distress.  Musculoskeletal:     Cervical back: Normal range of motion and neck supple. No rigidity or tenderness.  Lymphadenopathy:     Cervical: No cervical adenopathy.  Skin:    General: Skin is warm and dry.     Capillary Refill: Capillary refill takes less than 2 seconds.    Neurological:     General: No focal deficit present.     Mental Status: She is alert.  Psychiatric:        Mood and Affect: Mood normal.        Behavior: Behavior normal.     ED Results / Procedures / Treatments   Labs (all labs ordered are listed, but only abnormal results are displayed) Labs Reviewed - No data to display  EKG None  Radiology No results found.  Procedures .Marland KitchenIncision and Drainage  Date/Time: 02/18/2020 10:15 PM Performed by: Milagros Loll, MD Authorized by: Milagros Loll, MD   Consent:    Consent obtained:  Verbal   Consent given by:  Patient   Risks discussed:  Bleeding, damage to other organs, infection and incomplete drainage   Alternatives discussed:  No treatment, delayed treatment, alternative treatment, observation and referral Location:    Type:  Abscess   Size:  1   Location:  Head   Head location:  Face Pre-procedure details:    Skin preparation:  Betadine Anesthesia (see MAR for exact dosages):    Anesthesia method:  Local infiltration   Local anesthetic:  Lidocaine 1% w/o epi Procedure type:    Complexity:  Simple Procedure details:    Incision types:  Single straight   Scalpel blade:  11   Wound management:  Probed and deloculated   Drainage:  Purulent   Drainage amount:  Moderate   Wound treatment:  Wound left open   Packing materials:  None Post-procedure details:    Patient tolerance of procedure:  Tolerated well, no immediate complications   (including critical care time)  Medications Ordered in ED Medications  lidocaine (PF) (XYLOCAINE) 1 % injection 5 mL (has no administration in time range)    ED Course  I have reviewed the triage vital signs and the nursing notes.  Pertinent labs & imaging results that were available during my care of the patient were reviewed by me and considered in my medical decision making (see chart for details).    MDM Rules/Calculators/A&P                           39 year old lady presenting to ER with concern for small area of swelling on her right lower lateral face just above jawline.  On exam noted small area of fluctuance concerning for small abscess.  Incision and drainage with small incision successful drainage of moderate purulent material.  Patient tolerated well, recommended recheck with PCP for wound recheck in 2 to 3 days.    After the discussed management above, the patient was determined to be safe for discharge.  The patient was in agreement with this plan and all questions regarding their care were answered.  ED return precautions were discussed and the patient will return to the ED with any significant worsening of condition.  Final Clinical Impression(s) / ED Diagnoses Final diagnoses:  Facial abscess    Rx / DC Orders ED Discharge Orders    None       Milagros Loll, MD 02/18/20 2216

## 2020-02-18 NOTE — Discharge Instructions (Addendum)
Recommend follow-up with your primary doctor for recheck in 2 to 3 days.  If you are not able to get an appointment in a timely fashion, you may also return to the ER in 2 to 3 days for a wound recheck.  Recommend warm compresses twice daily for the next couple days.  If you have worsening swelling, redness, return to ER for reassessment.

## 2020-06-12 ENCOUNTER — Other Ambulatory Visit (HOSPITAL_COMMUNITY): Payer: Self-pay | Admitting: Family

## 2020-07-27 ENCOUNTER — Other Ambulatory Visit: Payer: Self-pay | Admitting: Family Medicine

## 2020-07-27 DIAGNOSIS — N6320 Unspecified lump in the left breast, unspecified quadrant: Secondary | ICD-10-CM

## 2020-07-27 DIAGNOSIS — N6310 Unspecified lump in the right breast, unspecified quadrant: Secondary | ICD-10-CM

## 2020-08-03 ENCOUNTER — Other Ambulatory Visit: Payer: Self-pay

## 2020-08-03 DIAGNOSIS — N63 Unspecified lump in unspecified breast: Secondary | ICD-10-CM

## 2020-08-03 DIAGNOSIS — N644 Mastodynia: Secondary | ICD-10-CM

## 2020-08-14 ENCOUNTER — Encounter (INDEPENDENT_AMBULATORY_CARE_PROVIDER_SITE_OTHER): Payer: Self-pay

## 2020-08-14 ENCOUNTER — Other Ambulatory Visit: Payer: Self-pay

## 2020-08-14 ENCOUNTER — Ambulatory Visit: Payer: Medicaid Other | Admitting: *Deleted

## 2020-08-14 VITALS — BP 130/82 | Wt 187.0 lb

## 2020-08-14 DIAGNOSIS — Z1239 Encounter for other screening for malignant neoplasm of breast: Secondary | ICD-10-CM

## 2020-08-14 DIAGNOSIS — N644 Mastodynia: Secondary | ICD-10-CM

## 2020-08-14 DIAGNOSIS — N6315 Unspecified lump in the right breast, overlapping quadrants: Secondary | ICD-10-CM

## 2020-08-14 NOTE — Progress Notes (Signed)
Ms. Lori Kelley is a 40 y.o. female who presents to [facility] clinic today with complaint of bilateral breast lumps found by her provider 07/27/2020. Patient stated she is unable to feel the lumps. Patient complained of left upper breast tenderness that comes and goes. Patient rates the pain at a 1 out of 10.    Pap Smear: Pap smear not completed today. Last Pap smear was 07/24/2020 at [facility] and was normal per patient. Per patient has no history of an abnormal Pap smear. Last Pap smear result is not available in Epic.   Physical exam: Breasts Breasts symmetrical. No skin abnormalities bilateral breasts. No nipple retraction bilateral breasts. No nipple discharge bilateral breasts. No lymphadenopathy. No lumps palpated left breast. Palpated a pea sized lump within the left breast at 6 o'clock 3 cm from the nipple. Complaints of left upper breast tenderness on exam.    Pelvic/Bimanual Pap is not indicated today per BCCCP guidelines.   Smoking History: Patient is a current smoker. Discussed smoking cessation with patient. Referred to the [facility] Quitline and gave resources to the free smoking cessation classes at [facility].   Patient Navigation: Patient education provided. Access to services provided for patient through BCCCP program.    Breast and Cervical Cancer Risk Assessment: Patient has family history of her sister having breast cancer. Patient has no known genetic mutations or history of radiation treatment to the chest before age 85. Patient does not have history of cervical dysplasia, immunocompromised, or DES exposure in-utero.  Risk Assessment    Risk Scores      08/14/2020   Last edited by: [name], LPN   5-year risk: 1 %   Lifetime risk: 14.9 %         A: BCCCP exam without pap smear Complaint of bilateral breast lumps.  P: Referred patient to the [facility] for a diagnostic mammogram. Appointment scheduled Tuesday, Aug 21, 2020 at 1400.  [name], RN 08/14/2020 1:22 PM

## 2020-08-14 NOTE — Patient Instructions (Signed)
Explained breast self awareness with Stacey Drain. Patient did not need a Pap smear today due to last Pap smear was 07/24/2020. Let her know BCCCP will cover Pap smears every 3 years unless has a history of abnormal Pap smears. Referred patient to the [facility] for a diagnostic mammogram. Appointment scheduled Tuesday, Aug 21, 2020 at 1400. Patient aware of appointment and will be there. Discussed smoking cessation with patient. Referred to the [facility] Quitline and gave resources to the free smoking cessation classes at [facility]. Stacey Drain verbalized understanding.  Sharif Rendell, Kathaleen Maser, RN 1:22 PM

## 2020-08-21 ENCOUNTER — Other Ambulatory Visit: Payer: Self-pay | Admitting: Obstetrics and Gynecology

## 2020-08-21 ENCOUNTER — Other Ambulatory Visit: Payer: Self-pay

## 2020-08-21 ENCOUNTER — Ambulatory Visit
Admission: RE | Admit: 2020-08-21 | Discharge: 2020-08-21 | Disposition: A | Discharge status: Home / Self Care | Payer: Medicaid Other | Source: Ambulatory Visit | Attending: Obstetrics and Gynecology | Admitting: Obstetrics and Gynecology

## 2020-08-21 ENCOUNTER — Ambulatory Visit
Admission: RE | Admit: 2020-08-21 | Discharge: 2020-08-21 | Disposition: A | Discharge status: Home / Self Care | Payer: No Typology Code available for payment source | Source: Ambulatory Visit | Attending: Obstetrics and Gynecology | Admitting: Obstetrics and Gynecology

## 2020-08-21 ENCOUNTER — Ambulatory Visit
Admission: RE | Admit: 2020-08-21 | Discharge: 2020-08-21 | Disposition: A | Discharge status: Home / Self Care | Payer: No Typology Code available for payment source | Source: Ambulatory Visit | Attending: Family Medicine | Admitting: Family Medicine

## 2020-08-21 DIAGNOSIS — N644 Mastodynia: Secondary | ICD-10-CM

## 2020-08-21 DIAGNOSIS — N6320 Unspecified lump in the left breast, unspecified quadrant: Secondary | ICD-10-CM

## 2020-08-21 DIAGNOSIS — N63 Unspecified lump in unspecified breast: Secondary | ICD-10-CM

## 2020-08-21 DIAGNOSIS — R599 Enlarged lymph nodes, unspecified: Secondary | ICD-10-CM

## 2020-08-21 DIAGNOSIS — N6310 Unspecified lump in the right breast, unspecified quadrant: Secondary | ICD-10-CM

## 2020-08-28 ENCOUNTER — Ambulatory Visit
Admission: RE | Admit: 2020-08-28 | Discharge: 2020-08-28 | Disposition: A | Discharge status: Home / Self Care | Payer: Medicaid Other | Source: Ambulatory Visit | Attending: Obstetrics and Gynecology | Admitting: Obstetrics and Gynecology

## 2020-08-28 ENCOUNTER — Other Ambulatory Visit: Payer: Self-pay

## 2020-08-28 ENCOUNTER — Ambulatory Visit
Admission: RE | Admit: 2020-08-28 | Discharge: 2020-08-28 | Disposition: A | Discharge status: Home / Self Care | Payer: No Typology Code available for payment source | Source: Ambulatory Visit | Attending: Obstetrics and Gynecology | Admitting: Obstetrics and Gynecology

## 2020-08-28 DIAGNOSIS — N6320 Unspecified lump in the left breast, unspecified quadrant: Secondary | ICD-10-CM

## 2020-08-28 DIAGNOSIS — R599 Enlarged lymph nodes, unspecified: Secondary | ICD-10-CM

## 2021-12-24 ENCOUNTER — Ambulatory Visit: Payer: Medicaid Other | Admitting: Nurse Practitioner

## 2021-12-30 ENCOUNTER — Other Ambulatory Visit: Payer: Self-pay

## 2021-12-30 ENCOUNTER — Emergency Department (HOSPITAL_BASED_OUTPATIENT_CLINIC_OR_DEPARTMENT_OTHER)
Admission: EM | Admit: 2021-12-30 | Discharge: 2021-12-30 | Disposition: A | Discharge status: Home / Self Care | Payer: PRIVATE HEALTH INSURANCE | Attending: Emergency Medicine | Admitting: Emergency Medicine

## 2021-12-30 ENCOUNTER — Encounter (HOSPITAL_BASED_OUTPATIENT_CLINIC_OR_DEPARTMENT_OTHER): Payer: Self-pay

## 2021-12-30 DIAGNOSIS — I10 Essential (primary) hypertension: Secondary | ICD-10-CM | POA: Diagnosis not present

## 2021-12-30 DIAGNOSIS — Z79899 Other long term (current) drug therapy: Secondary | ICD-10-CM | POA: Insufficient documentation

## 2021-12-30 DIAGNOSIS — W503XXA Accidental bite by another person, initial encounter: Secondary | ICD-10-CM | POA: Diagnosis not present

## 2021-12-30 DIAGNOSIS — S51852A Open bite of left forearm, initial encounter: Secondary | ICD-10-CM | POA: Diagnosis not present

## 2021-12-30 DIAGNOSIS — Z23 Encounter for immunization: Secondary | ICD-10-CM | POA: Diagnosis not present

## 2021-12-30 DIAGNOSIS — S59912A Unspecified injury of left forearm, initial encounter: Secondary | ICD-10-CM | POA: Diagnosis present

## 2021-12-30 MED ORDER — TETANUS-DIPHTH-ACELL PERTUSSIS 5-2.5-18.5 LF-MCG/0.5 IM SUSY
0.5000 mL | PREFILLED_SYRINGE | Freq: Once | INTRAMUSCULAR | Status: AC
  Administered 2021-12-30: 0.5 mL via INTRAMUSCULAR
  Filled 2021-12-30: qty 0.5

## 2021-12-30 MED ORDER — AMOXICILLIN-POT CLAVULANATE 875-125 MG PO TABS: 1.0000 | ORAL_TABLET | Freq: Two times a day (BID) | ORAL | 0 refills | Status: DC

## 2021-12-30 MED ORDER — AMOXICILLIN-POT CLAVULANATE 875-125 MG PO TABS
1.0000 | ORAL_TABLET | Freq: Once | ORAL | Status: AC
  Administered 2021-12-30: 1 via ORAL
  Filled 2021-12-30: qty 1

## 2021-12-30 NOTE — Discharge Instructions (Signed)
Your tetanus was updated today.  Take antibiotics twice daily for the next 7 days, take medication with food to help prevent upset stomach.  Monitor for signs of infection such as redness, swelling, increasing pain or puslike drainage.  If this occurs return to the emergency department or see your primary care doctor.

## 2021-12-30 NOTE — ED Notes (Signed)
RN provided AVS using Teachback Method. Patient verbalizes understanding of Discharge Instructions. Opportunity for Questioning and Answers were provided by RN. Patient Discharged from ED ambulatory to Home. ° °
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
## 2021-12-31 ENCOUNTER — Ambulatory Visit: Payer: Medicaid Other | Admitting: Nurse Practitioner

## 2021-12-31 ENCOUNTER — Encounter: Payer: Self-pay | Admitting: Nurse Practitioner

## 2021-12-31 VITALS — BP 122/78 | HR 92

## 2021-12-31 NOTE — Progress Notes (Signed)
Office Visit  Subjective:  Patient ID: Lori Kelley, female    DOB: 08-10-1980  Age: 41 y.o. MRN: [id number]  CC: Wellness Exam   HPI Lori Kelley presents for wellness exam visit for insurance benefit.  Patient is in between PCP right. Was established at [facility] but provider left and she is thinking of establishing with a PCP at [facility]. PMH significant for: HTN On HCTZ. History of addiction, on suboxone, follows with psychiatry at [facility].  Last labs per PCP were completed: 2022  Health Maintenance:  Colonoscopy: Sister died of colon cancer 30.  Mammogram: due for one this month, she will call to scheduled appointment.  PAP: due for this.   Smoker: Former  Immunizations:  COVID- x2, declines boosters  Tdap: 12/30/2021   Lifestyle: Diet- no specific diet.  Exercise- stays very active at work.     Past Medical History:  Diagnosis Date   Herpes    Hypertension     Past Surgical History:  Procedure Laterality Date   TOOTH EXTRACTION  12-2010    Outpatient Medications Prior to Visit  Medication Sig Dispense Refill   amoxicillin-clavulanate (AUGMENTIN) 875-125 MG tablet Take 1 tablet by mouth every 12 (twelve) hours. 14 tablet 0   buprenorphine-naloxone (SUBOXONE) 8-2 mg SUBL SL tablet Place 2 tablets under the tongue daily.     hydrochlorothiazide (HYDRODIURIL) 25 MG tablet Take 25 mg by mouth every morning.  9   Multiple Vitamin (MULTIVITAMIN WITH MINERALS) TABS tablet Take 1 tablet by mouth every morning.     Ascorbic Acid (VITAMIN C) 1000 MG tablet Take 1,000 mg by mouth daily. (Patient not taking: Reported on 12/31/2021)     buPROPion (WELLBUTRIN XL) 300 MG 24 hr tablet Take 300 mg by mouth daily. (Patient not taking: Reported on 12/31/2021)     cholecalciferol (VITAMIN D3) 25 MCG (1000 UT) tablet Take 1,000 Units by mouth daily. (Patient not taking: Reported on 12/31/2021)     cyclobenzaprine (FLEXERIL) 5 MG tablet Take 1 tablet (5 mg total) by  mouth 3 (three) times daily as needed (muscle soreness). (Patient not taking: Reported on 12/31/2021) 30 tablet 0   diphenhydrAMINE (BENADRYL) 25 MG tablet Take 1 tablet (25 mg total) by mouth every 6 (six) hours. (Patient not taking: Reported on 06/01/2015) 20 tablet 0   guaiFENesin-dextromethorphan (ROBITUSSIN DM) 100-10 MG/5ML syrup Take 5 mLs by mouth every 4 (four) hours as needed for cough. (Patient not taking: Reported on 04/12/2019) 118 mL 0   ibuprofen (ADVIL,MOTRIN) 600 MG tablet Take 1 tablet (600 mg total) by mouth every 6 (six) hours as needed. (Patient not taking: Reported on 04/12/2019) 30 tablet 0   medroxyPROGESTERone (DEPO-PROVERA) 150 MG/ML injection Inject 150 mg into the muscle every 3 (three) months. Next due 06/25/2015 (Patient not taking: Reported on 12/31/2021)     naproxen (NAPROSYN) 500 MG tablet Take 1 po BID with food prn pain 20 tablet 0   oxyCODONE-acetaminophen (PERCOCET/ROXICET) 5-325 MG per tablet Take 1-2 tablets by mouth every 6 (six) hours as needed for severe pain. (Patient not taking: Reported on 06/01/2015) 6 tablet 0   potassium chloride SA (KLOR-CON) 20 MEQ tablet Take 1 tablet (20 mEq total) by mouth 2 (two) times daily. (Patient not taking: Reported on 12/31/2021) 8 tablet 0   predniSONE (DELTASONE) 10 MG tablet Take 5 tab day 1, take 4 tab day 2, take 3 tab day 3, take 2 tab day 4, and take 1 tab day 5 (Patient not  taking: Reported on 06/01/2015) 15 tablet 0   pseudoephedrine (SUDAFED) 30 MG tablet Take 1 tablet (30 mg total) by mouth every 4 (four) hours as needed for congestion. (Patient not taking: Reported on 04/12/2019) 30 tablet 0   No facility-administered medications prior to visit.    ROS Review of Systems  Respiratory:  Negative for shortness of breath.   Cardiovascular:  Negative for chest pain and leg swelling.  Gastrointestinal:  Negative for constipation and diarrhea.  Neurological:  Negative for headaches.    Objective:  BP 122/78   Pulse 92    SpO2 98%   Physical Exam Constitutional:      General: She is not in acute distress. HENT:     Head: Normocephalic.  Cardiovascular:     Rate and Rhythm: Normal rate and regular rhythm.     Pulses: Normal pulses.     Heart sounds: Normal heart sounds.  Pulmonary:     Effort: Pulmonary effort is normal.  Musculoskeletal:        General: Normal range of motion.     Right lower leg: No edema.     Left lower leg: No edema.  Skin:    General: Skin is warm.  Neurological:     General: No focal deficit present.     Mental Status: She is alert and oriented to person, place, and time.  Psychiatric:        Mood and Affect: Mood normal.        Behavior: Behavior normal.      Assessment & Plan:    Lori Kelley was seen today for wellness exam.  Diagnoses and all orders for this visit:  Participant in health and wellness plan Adult wellness physical was conducted today. Importance of diet and exercise were discussed in detail.  We reviewed immunizations and gave recommendations regarding current immunization needed for age.  Preventative health exams needed: pap smear and possibly colonoscopy.   Patient was advised yearly wellness exam. Recommended establishing care with a PCP for ongoing care.   No orders of the defined types were placed in this encounter.   No orders of the defined types were placed in this encounter.   Follow-up: as needed.

## 2022-04-29 ENCOUNTER — Other Ambulatory Visit: Payer: Self-pay

## 2022-04-29 ENCOUNTER — Encounter (HOSPITAL_BASED_OUTPATIENT_CLINIC_OR_DEPARTMENT_OTHER): Payer: Self-pay | Admitting: Emergency Medicine

## 2022-04-29 ENCOUNTER — Emergency Department (HOSPITAL_BASED_OUTPATIENT_CLINIC_OR_DEPARTMENT_OTHER)
Admission: EM | Admit: 2022-04-29 | Discharge: 2022-04-29 | Disposition: A | Discharge status: Home / Self Care | Payer: Medicaid Other | Attending: Emergency Medicine | Admitting: Emergency Medicine

## 2022-04-29 DIAGNOSIS — Z79899 Other long term (current) drug therapy: Secondary | ICD-10-CM | POA: Insufficient documentation

## 2022-04-29 DIAGNOSIS — K0402 Irreversible pulpitis: Secondary | ICD-10-CM | POA: Diagnosis not present

## 2022-04-29 DIAGNOSIS — K0889 Other specified disorders of teeth and supporting structures: Secondary | ICD-10-CM

## 2022-04-29 DIAGNOSIS — I10 Essential (primary) hypertension: Secondary | ICD-10-CM | POA: Diagnosis not present

## 2022-04-29 DIAGNOSIS — K0401 Reversible pulpitis: Secondary | ICD-10-CM

## 2022-04-29 MED ORDER — KETOROLAC TROMETHAMINE 60 MG/2ML IM SOLN
60.0000 mg | Freq: Once | INTRAMUSCULAR | Status: AC
  Administered 2022-04-29: 60 mg via INTRAMUSCULAR
  Filled 2022-04-29: qty 2

## 2022-04-29 MED ORDER — AMOXICILLIN-POT CLAVULANATE 875-125 MG PO TABS
1.0000 | ORAL_TABLET | Freq: Once | ORAL | Status: AC
  Administered 2022-04-29: 1 via ORAL
  Filled 2022-04-29: qty 1

## 2022-04-29 MED ORDER — NAPROXEN 500 MG PO TABS: 500.0000 mg | ORAL_TABLET | Freq: Two times a day (BID) | ORAL | 0 refills | Status: DC

## 2022-04-29 MED ORDER — LIDOCAINE VISCOUS HCL 2 % MT SOLN: 5.0000 mL | Freq: Three times a day (TID) | OROMUCOSAL | 0 refills | Status: DC | PRN

## 2022-04-29 MED ORDER — AMOXICILLIN-POT CLAVULANATE 875-125 MG PO TABS: 1.0000 | ORAL_TABLET | Freq: Two times a day (BID) | ORAL | 0 refills | Status: DC

## 2022-04-29 NOTE — ED Notes (Signed)
Pt given discharge instructions and reviewed prescriptions. Opportunities given for questions. Pt verbalizes understanding. Leanne Chang, RN

## 2022-04-29 NOTE — ED Triage Notes (Signed)
  Patient comes in with dental injury to right bottom molar.  Patient states she broke the tooth last week while eating.  Has been trying to manage the pain with tylenol at home but it is not helping much.  Last dose 1000 mg at 0400 this morning.  Pain 8/10, sharp.

## 2022-04-29 NOTE — ED Provider Notes (Signed)
[facility] Provider Note   CSN: [id number] Arrival date & time: 04/29/22  1093     History  Chief Complaint  Patient presents with   Dental Injury    Lori Kelley is a 42 y.o. female.  With PMH of HTN who presents with dental pain of her right posterior mandibular molar over the past 2 weeks.  She said she bit down on something hard and then feels like she lost her tooth.  She has been having pain and some mild swelling at that site over the past 2 weeks.  She has been taking Tylenol without relief.  She has had no fevers at all, no voice change, able to tolerate secretions and p.o.  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  She has not tried to see a dentist yet.  She has not been on any antibiotics.   Dental Injury       Home Medications Prior to Admission medications   Medication Sig Start Date End Date Taking? Authorizing Provider  amoxicillin-clavulanate (AUGMENTIN) 875-125 MG tablet Take 1 tablet by mouth every 12 (twelve) hours. 04/29/22  Yes Elgie Congo, MD  magic mouthwash (lidocaine, diphenhydrAMINE, alum & mag hydroxide) suspension Swish and spit 5 mLs 3 (three) times daily as needed for mouth pain. 04/29/22  Yes Elgie Congo, MD  naproxen (NAPROSYN) 500 MG tablet Take 1 tablet (500 mg total) by mouth 2 (two) times daily. 04/29/22  Yes Elgie Congo, MD  amoxicillin-clavulanate (AUGMENTIN) 875-125 MG tablet Take 1 tablet by mouth every 12 (twelve) hours. 12/30/21   Jacqlyn Larsen, PA-C  buprenorphine-naloxone (SUBOXONE) 8-2 mg SUBL SL tablet Place 2 tablets under the tongue daily.    [provider]  hydrochlorothiazide (HYDRODIURIL) 25 MG tablet Take 25 mg by mouth every morning. 05/12/15   [provider]  Multiple Vitamin (MULTIVITAMIN WITH MINERALS) TABS tablet Take 1 tablet by mouth every morning.    [provider]      Allergies    Patient has no known  allergies.    Review of Systems   Review of Systems  Physical Exam Updated Vital Signs BP (!) 157/89 (BP Location: Right Arm)   Pulse 79   Temp 98.5 F (36.9 C) (Oral)   Resp 18   Ht 5\' 6"  (1.676 m)   Wt 84.4 kg   SpO2 100%   BMI 30.02 kg/m  Physical Exam Constitutional: Alert and oriented.  Slightly uncomfortable but no acute distress Eyes: Conjunctivae are normal. ENT      Head: Normocephalic and atraumatic.      Nose: No congestion.      Mouth/Throat: Mucous membranes are moist.  Dental caries present. Dental avulsion of the right posterior mandibular molar approximately #30, no periapical abscess but evidence of erythematous mildly edematous surrounding gingiva and pulp. Minimal indurated right lower swelling without fluctuance.      Neck: No stridor.  No drooling, no tripoding, no raised tongue, no mandibular or submental swelling Cardiovascular: S1, S2, regular rate Respiratory: Normal respiratory effort.  O2 sat 100 on RA Gastrointestinal: Nondistended Musculoskeletal: Normal range of motion in all extremities.. Neurologic: Normal speech and language.  No facial droop.  EOMI.  Tongue midline.  No gross focal neurologic deficits are appreciated. Skin: Skin is warm, dry and intact. No rash noted. Psychiatric: Mood and affect are normal. Speech and behavior are normal.  ED Results / Procedures / Treatments  Labs (all labs ordered are listed, but only abnormal results are displayed) Labs Reviewed - No data to display  EKG None  Radiology No results found.  Procedures Procedures    Medications Ordered in ED Medications  amoxicillin-clavulanate (AUGMENTIN) 875-125 MG per tablet 1 tablet (1 tablet Oral Given 04/29/22 0727)  ketorolac (TORADOL) injection 60 mg (60 mg Intramuscular Given 04/29/22 2025)    ED Course/ Medical Decision Making/ A&P   {                            Medical Decision Making Lori Kelley is a 42 y.o. female.  With PMH of HTN who  presents with dental pain of her right posterior mandibular molar over the past 2 weeks.    Patient presents with dental pain for the past 2 weeks.   Patient not immunosuppressed, afebrile and well appearing with patent airway, have low suspicfion for deep space infection or any concern for airway compromise. She is not septic.  She has no findings or red flags or symptoms concerning for Ludwigs angina.  There is no evidence of periapical abscess and no associated fluctuance or swelling of the face concerning for facial abscess.  Treating as suspected pulpitis starting patient on 7 days Augmentin provided analgesia in the ED and discharged with as needed Naprosyn as well as Magic mouthwash.  Provided list of dental resources.  Discussed strict return precautions and importance of following up with dental.  Patient understanding of plan and safe for discharge at this time.  Risk Prescription drug management.    Final Clinical Impression(s) / ED Diagnoses Final diagnoses:  Pain, dental  Pulpitis    Rx / DC Orders ED Discharge Orders          Ordered    amoxicillin-clavulanate (AUGMENTIN) 875-125 MG tablet  Every 12 hours        04/29/22 0720    naproxen (NAPROSYN) 500 MG tablet  2 times daily        04/29/22 0720    magic mouthwash (lidocaine, diphenhydrAMINE, alum & mag hydroxide) suspension  3 times daily PRN        04/29/22 0720              Elgie Congo, MD 04/29/22 [phone]

## 2022-04-29 NOTE — Discharge Instructions (Addendum)
You have been seen in the Emergency Department (ED)  today for dental pain.  Please take your prescribed antibiotic.  You may take pain medication either the Naprosyn as prescribed or ibuprofen 600 mg every 6 hours as needed with Tylenol 1 g every 6-8 hours.  You can also use the Magic mouthwash to help with pain relief.  Please call the numbers provided to you and make a dental appointment as soon as possible.  Only a dentist will be able to fix your problem(s).  Return to the ED  if you develop worsening pain, fever, pus/drainage, difficulty breathing, or other symptoms that concern you.

## 2022-08-22 ENCOUNTER — Emergency Department (HOSPITAL_COMMUNITY)
Admission: EM | Admit: 2022-08-22 | Discharge: 2022-08-22 | Disposition: A | Discharge status: Home / Self Care | Payer: BC Managed Care – PPO | Attending: Emergency Medicine | Admitting: Emergency Medicine

## 2022-08-22 ENCOUNTER — Other Ambulatory Visit: Payer: Self-pay

## 2022-08-22 ENCOUNTER — Emergency Department (HOSPITAL_COMMUNITY): Payer: BC Managed Care – PPO

## 2022-08-22 ENCOUNTER — Encounter (HOSPITAL_COMMUNITY): Payer: Self-pay

## 2022-08-22 ENCOUNTER — Emergency Department (HOSPITAL_COMMUNITY)
Admission: EM | Admit: 2022-08-22 | Discharge: 2022-08-22 | Disposition: A | Discharge status: Home / Self Care | Payer: BC Managed Care – PPO | Source: Home / Self Care | Attending: Emergency Medicine | Admitting: Emergency Medicine

## 2022-08-22 DIAGNOSIS — E876 Hypokalemia: Secondary | ICD-10-CM | POA: Diagnosis not present

## 2022-08-22 DIAGNOSIS — R1013 Epigastric pain: Secondary | ICD-10-CM | POA: Insufficient documentation

## 2022-08-22 DIAGNOSIS — I10 Essential (primary) hypertension: Secondary | ICD-10-CM | POA: Insufficient documentation

## 2022-08-22 DIAGNOSIS — Z79899 Other long term (current) drug therapy: Secondary | ICD-10-CM | POA: Insufficient documentation

## 2022-08-22 DIAGNOSIS — D649 Anemia, unspecified: Secondary | ICD-10-CM | POA: Diagnosis not present

## 2022-08-22 DIAGNOSIS — R112 Nausea with vomiting, unspecified: Secondary | ICD-10-CM | POA: Diagnosis present

## 2022-08-22 DIAGNOSIS — R197 Diarrhea, unspecified: Secondary | ICD-10-CM | POA: Insufficient documentation

## 2022-08-22 DIAGNOSIS — R55 Syncope and collapse: Secondary | ICD-10-CM | POA: Insufficient documentation

## 2022-08-22 DIAGNOSIS — M545 Low back pain, unspecified: Secondary | ICD-10-CM

## 2022-08-22 DIAGNOSIS — R1011 Right upper quadrant pain: Secondary | ICD-10-CM | POA: Insufficient documentation

## 2022-08-22 LAB — URINALYSIS, ROUTINE W REFLEX MICROSCOPIC
Bilirubin Urine: NEGATIVE
Bilirubin Urine: NEGATIVE
Glucose, UA: NEGATIVE mg/dL
Glucose, UA: NEGATIVE mg/dL
Ketones, ur: NEGATIVE mg/dL
Ketones, ur: NEGATIVE mg/dL
Leukocytes,Ua: NEGATIVE
Leukocytes,Ua: NEGATIVE
Nitrite: NEGATIVE
Nitrite: NEGATIVE
Protein, ur: NEGATIVE mg/dL
Protein, ur: NEGATIVE mg/dL
RBC / HPF: >50 RBC/hpf (ref 0–5)
Specific Gravity, Urine: 1.009 (ref 1.005–1.030)
Specific Gravity, Urine: 1.023 (ref 1.005–1.030)
pH: 5 (ref 5.0–8.0)
pH: 5 (ref 5.0–8.0)

## 2022-08-22 LAB — CBC WITH DIFFERENTIAL/PLATELET
Abs Immature Granulocytes: 0.03 10*3/uL (ref 0.00–0.07)
Basophils Absolute: 0 10*3/uL (ref 0.0–0.1)
Basophils Relative: 0 %
Eosinophils Absolute: 0.1 10*3/uL (ref 0.0–0.5)
Eosinophils Relative: 1 %
HCT: 33.7 % — ABNORMAL LOW (ref 36.0–46.0)
Hemoglobin: 11.6 g/dL — ABNORMAL LOW (ref 12.0–15.0)
Immature Granulocytes: 0 %
Lymphocytes Relative: 21 %
Lymphs Abs: 1.7 10*3/uL (ref 0.7–4.0)
MCH: 30.3 pg (ref 26.0–34.0)
MCHC: 34.4 g/dL (ref 30.0–36.0)
MCV: 88 fL (ref 80.0–100.0)
Monocytes Absolute: 0.6 10*3/uL (ref 0.1–1.0)
Monocytes Relative: 7 %
Neutro Abs: 5.8 10*3/uL (ref 1.7–7.7)
Neutrophils Relative %: 71 %
Platelets: 253 10*3/uL (ref 150–400)
RBC: 3.83 MIL/uL — ABNORMAL LOW (ref 3.87–5.11)
RDW: 12 % (ref 11.5–15.5)
WBC: 8.2 10*3/uL (ref 4.0–10.5)
nRBC: 0 % (ref 0.0–0.2)

## 2022-08-22 LAB — COMPREHENSIVE METABOLIC PANEL
ALT: 16 U/L (ref 0–44)
ALT: 17 U/L (ref 0–44)
AST: 19 U/L (ref 15–41)
AST: 20 U/L (ref 15–41)
Albumin: 3.2 g/dL — ABNORMAL LOW (ref 3.5–5.0)
Albumin: 3.5 g/dL (ref 3.5–5.0)
Alkaline Phosphatase: 75 U/L (ref 38–126)
Alkaline Phosphatase: 81 U/L (ref 38–126)
Anion gap: 9 (ref 5–15)
Anion gap: 6 (ref 5–15)
BUN: 11 mg/dL (ref 6–20)
BUN: 11 mg/dL (ref 6–20)
CO2: 26 mmol/L (ref 22–32)
CO2: 27 mmol/L (ref 22–32)
Calcium: 8.1 mg/dL — ABNORMAL LOW (ref 8.9–10.3)
Calcium: 8.2 mg/dL — ABNORMAL LOW (ref 8.9–10.3)
Chloride: 101 mmol/L (ref 98–111)
Chloride: 104 mmol/L (ref 98–111)
Creatinine, Ser: 0.84 mg/dL (ref 0.44–1.00)
Creatinine, Ser: 0.87 mg/dL (ref 0.44–1.00)
GFR, Estimated: >60 mL/min (ref >60)
GFR, Estimated: >60 mL/min (ref >60)
Glucose, Bld: 99 mg/dL (ref 70–99)
Glucose, Bld: 107 mg/dL — ABNORMAL HIGH (ref 70–99)
Potassium: 3 mmol/L — ABNORMAL LOW (ref 3.5–5.1)
Potassium: 3.5 mmol/L (ref 3.5–5.1)
Sodium: 136 mmol/L (ref 135–145)
Sodium: 137 mmol/L (ref 135–145)
Total Bilirubin: 0.6 mg/dL (ref 0.3–1.2)
Total Bilirubin: 0.4 mg/dL (ref 0.3–1.2)
Total Protein: 6.3 g/dL — ABNORMAL LOW (ref 6.5–8.1)
Total Protein: 6.9 g/dL (ref 6.5–8.1)

## 2022-08-22 LAB — LIPASE, BLOOD
Lipase: 23 U/L (ref 11–51)
Lipase: 25 U/L (ref 11–51)

## 2022-08-22 LAB — CBC
HCT: 34.1 % — ABNORMAL LOW (ref 36.0–46.0)
Hemoglobin: 11.6 g/dL — ABNORMAL LOW (ref 12.0–15.0)
MCH: 30.1 pg (ref 26.0–34.0)
MCHC: 34 g/dL (ref 30.0–36.0)
MCV: 88.6 fL (ref 80.0–100.0)
Platelets: 268 10*3/uL (ref 150–400)
RBC: 3.85 MIL/uL — ABNORMAL LOW (ref 3.87–5.11)
RDW: 12 % (ref 11.5–15.5)
WBC: 7.7 10*3/uL (ref 4.0–10.5)
nRBC: 0 % (ref 0.0–0.2)

## 2022-08-22 LAB — MAGNESIUM: Magnesium: 1.7 mg/dL (ref 1.7–2.4)

## 2022-08-22 LAB — I-STAT BETA HCG BLOOD, ED (MC, WL, AP ONLY): I-stat hCG, quantitative: <5 m[IU]/mL (ref <5)

## 2022-08-22 LAB — HCG, QUANTITATIVE, PREGNANCY: hCG, Beta Chain, Quant, S: <1 m[IU]/mL (ref <5)

## 2022-08-22 MED ORDER — ONDANSETRON 4 MG PO TBDP: 4.0000 mg | ORAL_TABLET | Freq: Three times a day (TID) | ORAL | 0 refills | Status: DC | PRN

## 2022-08-22 MED ORDER — FAMOTIDINE 20 MG PO TABS
20.0000 mg | ORAL_TABLET | Freq: Once | ORAL | Status: AC
  Administered 2022-08-22: 20 mg via ORAL
  Filled 2022-08-22: qty 1

## 2022-08-22 MED ORDER — SODIUM CHLORIDE 0.9 % IV BOLUS
1000.0000 mL | Freq: Once | INTRAVENOUS | Status: AC
  Administered 2022-08-22: 1000 mL via INTRAVENOUS

## 2022-08-22 MED ORDER — SODIUM CHLORIDE 0.9 % IV SOLN
12.5000 mg | Freq: Once | INTRAVENOUS | Status: AC
  Administered 2022-08-22: 12.5 mg via INTRAVENOUS
  Filled 2022-08-22: qty 12.5

## 2022-08-22 MED ORDER — DICYCLOMINE HCL 20 MG PO TABS: 20.0000 mg | ORAL_TABLET | Freq: Two times a day (BID) | ORAL | 0 refills | Status: DC | PRN

## 2022-08-22 MED ORDER — PROMETHAZINE HCL 25 MG RE SUPP: 25.0000 mg | Freq: Four times a day (QID) | RECTAL | 0 refills | Status: DC | PRN

## 2022-08-22 MED ORDER — ALUM & MAG HYDROXIDE-SIMETH 200-200-20 MG/5ML PO SUSP
15.0000 mL | Freq: Once | ORAL | Status: AC
  Administered 2022-08-22: 15 mL via ORAL
  Filled 2022-08-22: qty 30

## 2022-08-22 MED ORDER — IOHEXOL 300 MG/ML  SOLN
100.0000 mL | Freq: Once | INTRAMUSCULAR | Status: AC | PRN
  Administered 2022-08-22: 100 mL via INTRAVENOUS

## 2022-08-22 MED ORDER — POTASSIUM CHLORIDE CRYS ER 20 MEQ PO TBCR: 20.0000 meq | EXTENDED_RELEASE_TABLET | Freq: Every day | ORAL | 0 refills | Status: DC

## 2022-08-22 MED ORDER — DICYCLOMINE HCL 10 MG PO CAPS
10.0000 mg | ORAL_CAPSULE | Freq: Once | ORAL | Status: AC
  Administered 2022-08-22: 10 mg via ORAL
  Filled 2022-08-22: qty 1

## 2022-08-22 MED ORDER — POTASSIUM CHLORIDE CRYS ER 20 MEQ PO TBCR
40.0000 meq | EXTENDED_RELEASE_TABLET | Freq: Once | ORAL | Status: AC
  Administered 2022-08-22: 40 meq via ORAL
  Filled 2022-08-22: qty 2

## 2022-08-22 MED ORDER — ONDANSETRON HCL 4 MG/2ML IJ SOLN
4.0000 mg | Freq: Once | INTRAMUSCULAR | Status: AC
  Administered 2022-08-22: 4 mg via INTRAVENOUS
  Filled 2022-08-22: qty 2

## 2022-08-22 MED ORDER — SODIUM CHLORIDE (PF) 0.9 % IJ SOLN
INTRAMUSCULAR | Status: AC
  Filled 2022-08-22: qty 50

## 2022-08-22 NOTE — ED Provider Notes (Signed)
[place] [facility]MENT AT [facility] Provider Note   CSN: [id number] Arrival date & time: 08/22/22  0434     History  Chief Complaint  Patient presents with   Emesis   Loss of Consciousness    Lori Kelley is a 42 y.o. female.  42 year old female brought in by EMS for n/v/d and syncopal episode today. Patient reports 6 episodes of loose, non bloody stools in the past 24 hours, multiple episodes of vomiting (non bloody) with abdominal cramping. Woke up with morning to go to the bathroom, called to her daughter and then passed out. Patient's daughter found her on the floor. Patient denies any injuries from the fall, notes back pain on exam. States others in her home are also sick with vomiting and diarrhea. Had dental surgery/dentures 1 week ago. No fevers. No other complaints or concerns.        Home Medications Prior to Admission medications   Medication Sig Start Date End Date Taking? Authorizing Provider  ondansetron (ZOFRAN-ODT) 4 MG disintegrating tablet Take 1 tablet (4 mg total) by mouth every 8 (eight) hours as needed for nausea or vomiting. 08/22/22  Yes Army Melia A, PA-C  potassium chloride SA (KLOR-CON M) 20 MEQ tablet Take 1 tablet (20 mEq total) by mouth daily. 08/22/22  Yes Jeannie Fend, PA-C  amoxicillin-clavulanate (AUGMENTIN) 875-125 MG tablet Take 1 tablet by mouth every 12 (twelve) hours. 12/30/21   Dartha Lodge, PA-C  amoxicillin-clavulanate (AUGMENTIN) 875-125 MG tablet Take 1 tablet by mouth every 12 (twelve) hours. 04/29/22   Mardene Sayer, MD  buprenorphine-naloxone (SUBOXONE) 8-2 mg SUBL SL tablet Place 2 tablets under the tongue daily.    [provider]  hydrochlorothiazide (HYDRODIURIL) 25 MG tablet Take 25 mg by mouth every morning. 05/12/15   [provider]  magic mouthwash (lidocaine, diphenhydrAMINE, alum & mag hydroxide) suspension Swish and spit 5 mLs 3 (three) times daily as needed for mouth pain.  04/29/22   Mardene Sayer, MD  Multiple Vitamin (MULTIVITAMIN WITH MINERALS) TABS tablet Take 1 tablet by mouth every morning.    [provider]  naproxen (NAPROSYN) 500 MG tablet Take 1 tablet (500 mg total) by mouth 2 (two) times daily. 04/29/22   Mardene Sayer, MD      Allergies    Patient has no known allergies.    Review of Systems   Review of Systems Negative except as per HPI Physical Exam Updated Vital Signs BP 121/75   Pulse 63   Temp 98.8 F (37.1 C) (Oral)   Resp 16   Ht 5\' 6"  (1.676 m)   Wt 84.4 kg   SpO2 98%   BMI 30.03 kg/m  Physical Exam Vitals and nursing note reviewed.  Constitutional:      General: She is not in acute distress.    Appearance: She is well-developed. She is not diaphoretic.  HENT:     Head: Normocephalic and atraumatic.  Cardiovascular:     Rate and Rhythm: Normal rate and regular rhythm.     Pulses: Normal pulses.     Heart sounds: Normal heart sounds.  Pulmonary:     Effort: Pulmonary effort is normal.     Breath sounds: Normal breath sounds.  Abdominal:     Palpations: Abdomen is soft.     Tenderness: There is no abdominal tenderness.  Musculoskeletal:       Back:     Right lower leg: No edema.  Left lower leg: No edema.  Skin:    General: Skin is warm and dry.     Findings: No erythema or rash.  Neurological:     Mental Status: She is alert and oriented to person, place, and time.     Cranial Nerves: No cranial nerve deficit.     Sensory: No sensory deficit.  Psychiatric:        Behavior: Behavior normal.     ED Results / Procedures / Treatments   Labs (all labs ordered are listed, but only abnormal results are displayed) Labs Reviewed  CBC WITH DIFFERENTIAL/PLATELET - Abnormal; Notable for the following components:      Result Value   RBC 3.83 (*)    Hemoglobin 11.6 (*)    HCT 33.7 (*)    All other components within normal limits  COMPREHENSIVE METABOLIC PANEL - Abnormal; Notable for the  following components:   Potassium 3.0 (*)    Calcium 8.1 (*)    Total Protein 6.3 (*)    Albumin 3.2 (*)    All other components within normal limits  LIPASE, BLOOD  MAGNESIUM  HCG, QUANTITATIVE, PREGNANCY  URINALYSIS, ROUTINE W REFLEX MICROSCOPIC    EKG EKG Interpretation  Date/Time:  Friday Aug 22 2022 04:58:26 EDT Ventricular Rate:  65 PR Interval:  174 QRS Duration: 103 QT Interval:  379 QTC Calculation: 394 R Axis:   32 Text Interpretation: Sinus rhythm RSR' in V1 or V2, probably normal variant Borderline T abnormalities, anterior leads No significant change was found Confirmed by Drema Pry [phone]) on 08/22/2022 5:18:38 AM  Radiology DG Lumbar Spine Complete  Result Date: 08/22/2022 CLINICAL DATA:  Fall with low back pain. EXAM: LUMBAR SPINE - COMPLETE 4+ VIEW COMPARISON:  None Available. FINDINGS: There is no evidence of lumbar spine fracture. Alignment is normal. Intervertebral disc spaces are maintained. IMPRESSION: Negative. Electronically Signed   By: Almira Bar M.D.   On: 08/22/2022 06:57    Procedures Procedures    Medications Ordered in ED Medications  ondansetron (ZOFRAN) injection 4 mg (4 mg Intravenous Given 08/22/22 0518)  sodium chloride 0.9 % bolus 1,000 mL (1,000 mLs Intravenous New Bag/Given 08/22/22 0518)  potassium chloride SA (KLOR-CON M) CR tablet 40 mEq (40 mEq Oral Given 08/22/22 0630)    ED Course/ Medical Decision Making/ A&P                             Medical Decision Making Amount and/or Complexity of Data Reviewed Labs: ordered.  Risk Prescription drug management.   This patient presents to the ED for concern of n/v/d, syncope, this involves an extensive number of treatment options, and is a complaint that carries with it a high risk of complications and morbidity.  The differential diagnosis includes but not limited to dehydration, metabolic abnormality, fracture    Co morbidities that complicate the patient  evaluation  HTN, recent dental surgery   Additional history obtained:  External records from outside source obtained and reviewed including prior labs on file for comparison    Lab Tests:  I Ordered, and personally interpreted labs.  The pertinent results include:  lipase 23. CBC with mild anemia with hgb 11.6. Mg WNL. CMP with mild hypokalemia at 3.0. HCG negative   Imaging Studies ordered:  I ordered imaging studies including XR lumbar spine  I independently visualized and interpreted imaging which showed no acute bony abnormality I agree with the radiologist interpretation  Cardiac Monitoring: / EKG:  The patient was maintained on a cardiac monitor.  I personally viewed and interpreted the cardiac monitored which showed an underlying rhythm of: Sinus rhythm, rate 65   Problem List / ED Course / Critical interventions / Medication management  42 year old female presents via EMS after syncopal episode this morning.  Patient states that she has had multiple episodes of vomiting and diarrhea over the past 24 hours, exposed to several family members with similar symptoms.  Patient got up to go to the bathroom in the night, did not feel well and called for her daughter to help her then passed out.  States that she woke up lying on the floor, does not know of any injuries however is found to have mild low back pain on exam.  Found to have mild hypokalemia, replaced orally.  X-ray lumbar spine without acute bony abnormality.  Patient tolerating oral fluids.  Discharged home to recheck with PCP.  Recommend bland diet, progress slowly as tolerated. I ordered medication including kdur, IVF, zofran  for hypokalemia, nausea Reevaluation of the patient after these medicines showed that the patient improved.  I have reviewed the patients home medicines and have made adjustments as needed   Social Determinants of Health:  Lives with family   Test / Admission - Considered:  Stable for  discharge         Final Clinical Impression(s) / ED Diagnoses Final diagnoses:  Nausea vomiting and diarrhea  Hypokalemia  Syncope, unspecified syncope type    Rx / DC Orders ED Discharge Orders          Ordered    ondansetron (ZOFRAN-ODT) 4 MG disintegrating tablet  Every 8 hours PRN        08/22/22 0603    potassium chloride SA (KLOR-CON M) 20 MEQ tablet  Daily        08/22/22 0603              Jeannie Fend, PA-C 08/22/22 1610    Nira Conn, MD 08/22/22 [phone]

## 2022-08-22 NOTE — ED Triage Notes (Signed)
Patient BIB EMS for N/V/D x 1 day and syncopal episode tonight. Unknown if patient hit head, denies taking blood thinners. Patient currently c/o nausea and abdominal pain. Patient is A&Ox4 and NAD at time of triage.

## 2022-08-22 NOTE — ED Triage Notes (Addendum)
Patient BIB [facility] from home. Discharged at 5am today. Diagnosed with endometriosis. Lower abdominal pain has worsened since discharge. Denies dizziness.

## 2022-08-22 NOTE — Discharge Instructions (Signed)
Liquid diet, progress slowly to bland diet as tolerated.  Recheck with your primary care provider.  Return to ER for worsening or concerning symptoms.  Your potassium was slightly low today, please take oral supplement at home as prescribed for the next few days.  Also provided with prescription for Zofran to take for nausea and vomiting as needed.

## 2022-08-22 NOTE — Discharge Instructions (Addendum)
Note the workup today was overall reassuring.  No acute abnormality appreciated on CT imaging of your abdomen causing your symptoms.  Symptoms most likely secondary to viral illness.  Will try additional nausea/vomiting medicine called Phenergan to take rectally when oral Zofran does not work.  Will also send in Bentyl to take as needed for abdominal discomfort.  Recommend bland diet over the next several days into able to tolerate more complex foods.  Recommend follow-up with primary care for reassessment of your symptoms.  Please not hesitate to return to emergency department for worrisome signs and symptoms we discussed become apparent.

## 2022-08-22 NOTE — ED Notes (Signed)
Patient given water

## 2022-08-22 NOTE — ED Provider Triage Note (Signed)
Emergency Medicine Provider Triage Evaluation Note  Lori Kelley , a 42 y.o. female  was evaluated in triage.  Pt complains of nausea, vomiting, diarrhea, syncopal episode.  Patient was seen earlier this morning with the same symptoms.  States that she is feeling well upon discharge but then abdominal pain, nausea, vomiting return.  States that she had a syncopal episode during episode of vomiting earlier today where she is out for a matter of seconds before regaining consciousness.  Reports pain in epigastric region without radiation.  Denies fever, hematemesis, hematochezia/melena.  Reports other known sick contacts with similar symptoms..  Review of Systems  Positive: See above Negative:   Physical Exam  BP 120/79   Pulse 65   Temp 98.2 F (36.8 C) (Oral)   Resp 18   Ht 5\' 6"  (1.676 m)   Wt 85 kg   SpO2 98%   BMI 30.25 kg/m  Gen:   Awake, no distress   Resp:  Normal effort  MSK:   Moves extremities without difficulty  Other:    Medical Decision Making  Medically screening exam initiated at 12:45 PM.  Appropriate orders placed.  Lahoma O Cahn was informed that the remainder of the evaluation will be completed by another provider, this initial triage assessment does not replace that evaluation, and the importance of remaining in the ED until their evaluation is complete.     Peter Garter, [place] 08/22/22 1247

## 2022-08-22 NOTE — ED Provider Notes (Signed)
[place] EMERGENCY DEPARTMENT AT [facility] Provider Note   CSN: [id number] Arrival date & time: 08/22/22  1215     History  Chief Complaint  Patient presents with   Abdominal Pain    Lori Kelley is a 42 y.o. female.   Abdominal Pain   42 year old female presents emergency department with complaints of nausea, vomiting, diarrhea, syncopal episode.  Patient was seen earlier this morning with the same symptoms.  States that she is feeling well upon discharge but then abdominal pain, nausea, vomiting return.  States that she had a syncopal episode during episode of vomiting earlier today where she is out for a matter of seconds before regaining consciousness.  Reports pain in epigastric region without radiation.  Denies fever, hematemesis, hematochezia/melena.  Reports other known sick contacts with similar symptoms.  Past medical history significant for hypertension  Home Medications Prior to Admission medications   Medication Sig Start Date End Date Taking? Authorizing Provider  dicyclomine (BENTYL) 20 MG tablet Take 1 tablet (20 mg total) by mouth 2 (two) times daily as needed for spasms. 08/22/22  Yes Peter Garter, PA  promethazine (PHENERGAN) 25 MG suppository Place 1 suppository (25 mg total) rectally every 6 (six) hours as needed for nausea or vomiting. 08/22/22  Yes Sherian Maroon A, PA  amoxicillin-clavulanate (AUGMENTIN) 875-125 MG tablet Take 1 tablet by mouth every 12 (twelve) hours. 12/30/21   Dartha Lodge, PA-C  amoxicillin-clavulanate (AUGMENTIN) 875-125 MG tablet Take 1 tablet by mouth every 12 (twelve) hours. 04/29/22   Mardene Sayer, MD  buprenorphine-naloxone (SUBOXONE) 8-2 mg SUBL SL tablet Place 2 tablets under the tongue daily.    [provider]  hydrochlorothiazide (HYDRODIURIL) 25 MG tablet Take 25 mg by mouth every morning. 05/12/15   [provider]  magic mouthwash (lidocaine, diphenhydrAMINE, alum & mag  hydroxide) suspension Swish and spit 5 mLs 3 (three) times daily as needed for mouth pain. 04/29/22   Mardene Sayer, MD  Multiple Vitamin (MULTIVITAMIN WITH MINERALS) TABS tablet Take 1 tablet by mouth every morning.    [provider]  naproxen (NAPROSYN) 500 MG tablet Take 1 tablet (500 mg total) by mouth 2 (two) times daily. 04/29/22   Mardene Sayer, MD  ondansetron (ZOFRAN-ODT) 4 MG disintegrating tablet Take 1 tablet (4 mg total) by mouth every 8 (eight) hours as needed for nausea or vomiting. 08/22/22   Army Melia A, PA-C  potassium chloride SA (KLOR-CON M) 20 MEQ tablet Take 1 tablet (20 mEq total) by mouth daily. 08/22/22   Jeannie Fend, PA-C      Allergies    Patient has no known allergies.    Review of Systems   Review of Systems  Gastrointestinal:  Positive for abdominal pain.  All other systems reviewed and are negative.   Physical Exam Updated Vital Signs BP (!) 140/80 (BP Location: Right Arm)   Pulse 60   Temp 98.2 F (36.8 C) (Oral)   Resp 16   Ht 5\' 6"  (1.676 m)   Wt 85 kg   LMP  (LMP Unknown) Comment: neg preg test 08/22/2022  SpO2 99%   BMI 30.25 kg/m  Physical Exam Vitals and nursing note reviewed.  Constitutional:      General: She is not in acute distress.    Appearance: She is well-developed.  HENT:     Head: Normocephalic and atraumatic.  Eyes:     Conjunctiva/sclera: Conjunctivae normal.  Cardiovascular:  Rate and Rhythm: Normal rate and regular rhythm.     Heart sounds: No murmur heard. Pulmonary:     Effort: Pulmonary effort is normal. No respiratory distress.     Breath sounds: Normal breath sounds.  Abdominal:     Palpations: Abdomen is soft.     Tenderness: There is abdominal tenderness in the right upper quadrant and epigastric area. There is no right CVA tenderness or left CVA tenderness.  Musculoskeletal:        General: No swelling.     Cervical back: Neck supple.  Skin:    General: Skin is warm and dry.      Capillary Refill: Capillary refill takes less than 2 seconds.  Neurological:     Mental Status: She is alert.  Psychiatric:        Mood and Affect: Mood normal.     ED Results / Procedures / Treatments   Labs (all labs ordered are listed, but only abnormal results are displayed) Labs Reviewed  COMPREHENSIVE METABOLIC PANEL - Abnormal; Notable for the following components:      Result Value   Glucose, Bld 107 (*)    Calcium 8.2 (*)    All other components within normal limits  CBC - Abnormal; Notable for the following components:   RBC 3.85 (*)    Hemoglobin 11.6 (*)    HCT 34.1 (*)    All other components within normal limits  URINALYSIS, ROUTINE W REFLEX MICROSCOPIC - Abnormal; Notable for the following components:   Hgb urine dipstick MODERATE (*)    Bacteria, UA RARE (*)    All other components within normal limits  LIPASE, BLOOD  I-STAT BETA HCG BLOOD, ED ([facility], [facility], [facility] ONLY)    EKG None  Radiology DG Knee Complete 4 Views Right  Result Date: 08/22/2022 CLINICAL DATA:  Pain EXAM: RIGHT KNEE - COMPLETE 4+ VIEW COMPARISON:  None Available. FINDINGS: No evidence of fracture, dislocation, or joint effusion. No evidence of arthropathy or other focal bone abnormality. Soft tissues are unremarkable. IMPRESSION: Negative. Electronically Signed   By: Duanne Guess D.O.   On: 08/22/2022 16:50   DG Knee Complete 4 Views Left  Result Date: 08/22/2022 CLINICAL DATA:  Nausea and vomiting.  Fall on knees. EXAM: LEFT KNEE - COMPLETE 4+ VIEW COMPARISON:  None Available. FINDINGS: No fracture of the proximal tibia or distal femur. Patella is normal. No joint effusion. IMPRESSION: No fracture or dislocation. Electronically Signed   By: Genevive Bi M.D.   On: 08/22/2022 16:48   CT ABDOMEN PELVIS W CONTRAST  Result Date: 08/22/2022 CLINICAL DATA:  Abdominal pain, vomiting, diarrhea, syncope EXAM: CT ABDOMEN AND PELVIS WITH CONTRAST TECHNIQUE: Multidetector CT imaging of the abdomen  and pelvis was performed using the standard protocol following bolus administration of intravenous contrast. RADIATION DOSE REDUCTION: This exam was performed according to the departmental dose-optimization program which includes automated exposure control, adjustment of the mA and/or kV according to patient size and/or use of iterative reconstruction technique. CONTRAST:  OMNIPAQUE IOHEXOL 300 MG/ML  SOLN COMPARISON:  None Available. FINDINGS: Lower chest: Small linear densities are seen in the posterior lower lung fields suggesting minimal scarring or subsegmental atelectasis. Hepatobiliary: Liver measures 17.9 cm in length. There is decreased density in liver in relation to spleen. There is no dilation of bile ducts. Gallbladder is unremarkable. Pancreas: Pancreas is not enlarged. There is slight prominence of pancreatic duct in the head of the pancreas. There are no loculated fluid collections in  or around the pancreas. Spleen: Unremarkable. Adrenals/Urinary Tract: Adrenals are unremarkable. Right renal pelvis appears more prominent on the left. Proximal right ureter is slightly dilated. There is no perinephric fluid collection. There are no opaque renal or ureteral stones. Urinary bladder is unremarkable. Stomach/Bowel: Stomach is unremarkable. Small bowel loops are not dilated. Appendix is difficult to visualize. In image 70 of series 7, there is small caliber structure slightly below the level the cecum, possibly appendix there is no pericecal inflammation. There is no significant wall thickening in colon there is no pericolic stranding. Moderate amount of stool is seen in the ascending, transverse and descending colon. Vascular/Lymphatic: Vascular structures are unremarkable. Subcentimeter nodes are seen in mesentery. Reproductive: There is a 4.1 x 3.1 cm fluid density lesion in the left adnexa. Trace amount of free fluid is seen in left adnexa. Other: There is no pneumoperitoneum. Umbilical hernia  containing fat is seen. Musculoskeletal: No acute findings are seen. IMPRESSION: There is no evidence of intestinal obstruction or pneumoperitoneum. There is no hydronephrosis. There is 4.1 cm fluid density structure in the left adnexa suggesting possible functional ovarian cyst. Small amount of free fluid in pelvis may be due to recent rupture of ovarian cyst or follicle. Umbilical hernia containing fat is seen.  Fatty liver. Electronically Signed   By: Ernie Avena M.D.   On: 08/22/2022 16:42   DG Lumbar Spine Complete  Result Date: 08/22/2022 CLINICAL DATA:  Fall with low back pain. EXAM: LUMBAR SPINE - COMPLETE 4+ VIEW COMPARISON:  None Available. FINDINGS: There is no evidence of lumbar spine fracture. Alignment is normal. Intervertebral disc spaces are maintained. IMPRESSION: Negative. Electronically Signed   By: Almira Bar M.D.   On: 08/22/2022 06:57    Procedures Procedures    Medications Ordered in ED Medications  sodium chloride 0.9 % bolus 1,000 mL (0 mLs Intravenous Stopped 08/22/22 1658)  dicyclomine (BENTYL) capsule 10 mg (10 mg Oral Given 08/22/22 1527)  promethazine (PHENERGAN) 12.5 mg in sodium chloride 0.9 % 50 mL IVPB (0 mg Intravenous Stopped 08/22/22 1658)  iohexol (OMNIPAQUE) 300 MG/ML solution 100 mL (100 mLs Intravenous Contrast Given 08/22/22 1559)  alum & mag hydroxide-simeth (MAALOX/MYLANTA) 200-200-20 MG/5ML suspension 15 mL (15 mLs Oral Given 08/22/22 1722)  famotidine (PEPCID) tablet 20 mg (20 mg Oral Given 08/22/22 1722)    ED Course/ Medical Decision Making/ A&P Clinical Course as of 08/22/22 1754  Fri Aug 22, 2022  1533 While waiting for room, patient reportedly stood up and "threw herself" on the ground per nursing staff.  Patient states that she stood up felt lightheaded and fell forwards landing on both knees.  Denies trauma to head, loss of consciousness.  Currently complaining of bilateral knee Pain.  Will obtain x-ray imaging of bilateral knees with  continued workup of abdominal pain and original complaints. [CR]    Clinical Course User Index [CR] Peter Garter, PA                             Medical Decision Making Amount and/or Complexity of Data Reviewed Labs: ordered. Radiology: ordered.  Risk OTC drugs. Prescription drug management.   This patient presents to the ED for concern of abdominal pain, this involves an extensive number of treatment options, and is a complaint that carries with it a high risk of complications and morbidity.  The differential diagnosis includes gastritis, PUD, pancreatitis, CBD pathology, cholecystitis, hepatitis, SBO/LBO, volvulus, diverticulitis, appendicitis, cystitis,  nephrolithiasis, pyelonephritis, ectopic pregnancy, ovarian torsion, PID, tubo-ovarian abscess, mesenteric ischemia   Co morbidities that complicate the patient evaluation  See HPI   Additional history obtained:  Additional history obtained from EMR External records from outside source obtained and reviewed including hospital records   Lab Tests:  I Ordered, and personally interpreted labs.  The pertinent results include: Mild hypocalcemia of 8.2 but otherwise, electrolytes within normal limits.  No transaminitis.  No renal dysfunction.  No leukocytosis.  Platelets within range.  Patient with a hemoglobin 11.6 of which is baseline per prior laboratory studies obtained.  Beta-hCG negative.  Lipase within normal limits.  UA significant for rare bacteria with 6-10 squamous epithelial cells.   Imaging Studies ordered:  I ordered imaging studies including CT abdomen pelvis I independently visualized and interpreted imaging which showed no acute abnormality.  Patient with evidence of 41 cm fluid density in left adnexa.  Fat-containing umbilical hernia Right and left knee x-ray: No acute abnormality   Cardiac Monitoring: / EKG:  The patient was maintained on a cardiac monitor.  I personally viewed and interpreted the  cardiac monitored which showed an underlying rhythm of: Sinus rhythm   Consultations Obtained:  N/a   Problem List / ED Course / Critical interventions / Medication management  Nausea, vomiting, diarrhea I ordered medication including 1 L of normal saline, Phenergan, Bentyl   Reevaluation of the patient after these medicines showed that the patient improved I have reviewed the patients home medicines and have made adjustments as needed   Social Determinants of Health:  Denies tobacco, illicit drug use   Test / Admission - Considered:  Nausea, vomiting, diarrhea Vitals signs significant for hypertension with blood pressure 140/80. Otherwise within normal range and stable throughout visit. Laboratory/imaging studies significant for: See above 42 year old female presents emergency department with complaints of nausea, vomiting as well as diarrhea.  Patient without any vomiting or diarrhea during 5 hours while in the emergency department.  Patient did note improvement/resolution of symptoms with administration of medicines while in the emergency department.  Able to tolerate p.o. without repeat bouts of emesis or increasing abdominal pain.  Will send home with rectal Phenergan to use as needed when Zofran is not sufficient.  Recommend bland diet over the next several days until able to tolerate more complex foods.  Patient's symptoms most likely secondary to viral illness given multiple sick exposures with similar symptoms.  Patient overall well-appearing, afebrile in no acute distress.  Treatment plan discussed with patient and she acknowledged understanding was agreeable to said plan. Worrisome signs and symptoms were discussed with the patient, and the patient acknowledged understanding to return to the ED if noticed. Patient was stable upon discharge.          Final Clinical Impression(s) / ED Diagnoses Final diagnoses:  Nausea vomiting and diarrhea    Rx / DC Orders ED  Discharge Orders          Ordered    promethazine (PHENERGAN) 25 MG suppository  Every 6 hours PRN        08/22/22 1658    dicyclomine (BENTYL) 20 MG tablet  2 times daily PRN        08/22/22 1658              Peter Garter, [place] 08/22/22 1754    Tegeler, Canary Brim, MD 08/23/22 [phone]

## 2022-08-24 IMAGING — US US BREAST*L* LIMITED INC AXILLA
1 series · 13 of 13 positions shown · non-contrast
Comparison: None.

CLINICAL DATA: The patient reports that her primary care physician
felt 2 lumps in each breast on recent physical examination. She was
subsequently seen by physician at [REDACTED] and there was no palpable
mass in either breast at that time. The patient does not feel a mass
in either breast today. The patient had a left arm [id number] vaccine
in April 2020.

EXAM:
DIGITAL DIAGNOSTIC BILATERAL MAMMOGRAM WITH TOMOSYNTHESIS AND CAD;
ULTRASOUND LEFT BREAST LIMITED
TECHNIQUE: Bilateral digital diagnostic mammography and breast tomosynthesis
was performed. The images were evaluated with computer-aided
detection.; Targeted ultrasound examination of the left breast was
performed

[Series 1: us breast*left* limited inc axilla · 0.06mm/px · 13 of 13 slices shown]
[im 1/13]
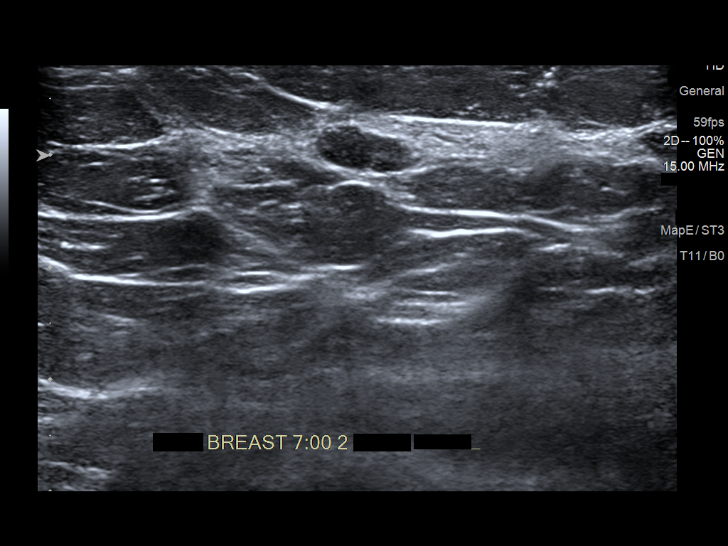
[im 2/13]
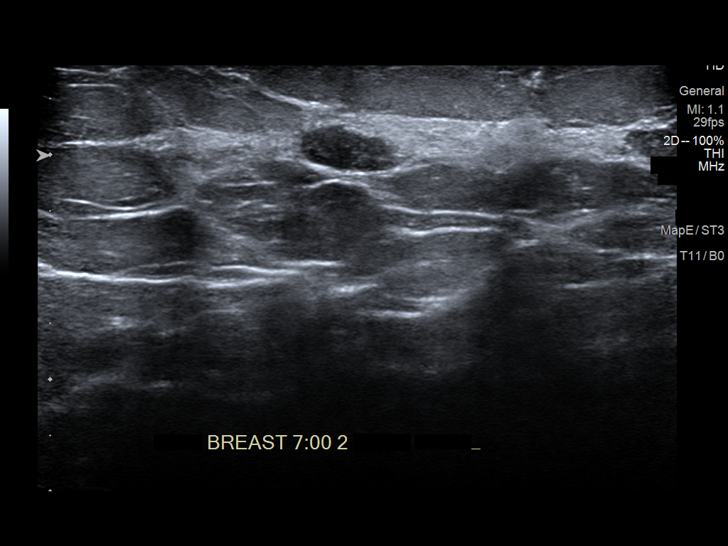
[im 3/13]
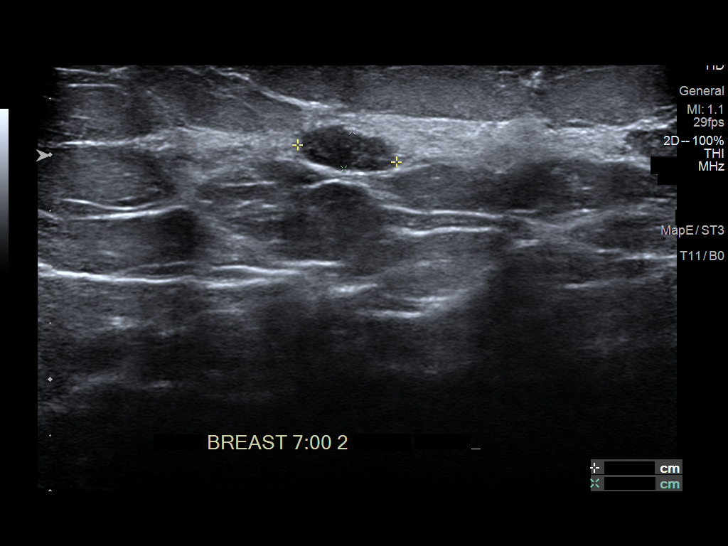
[im 4/13]
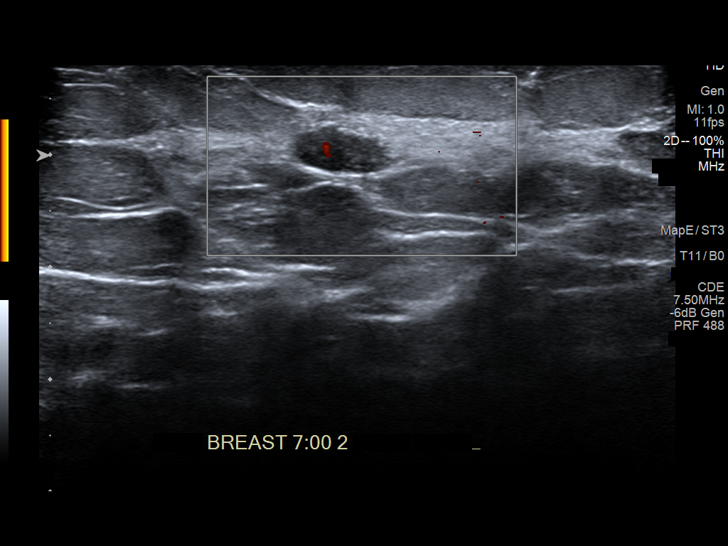
[im 5/13]
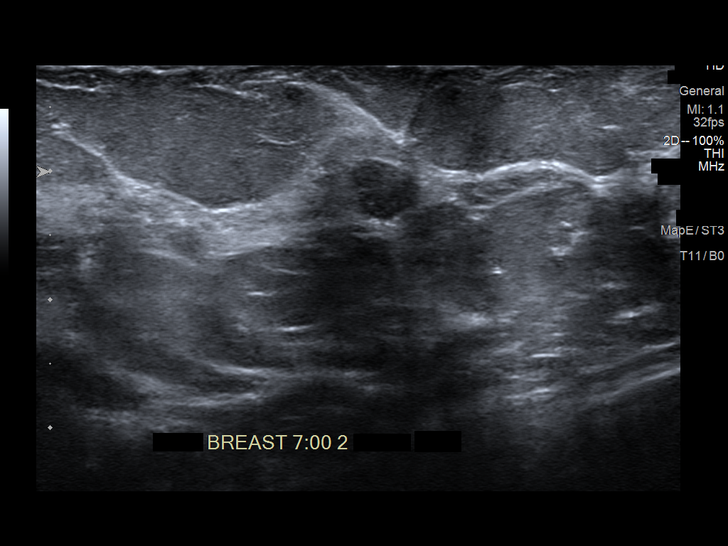
[im 6/13]
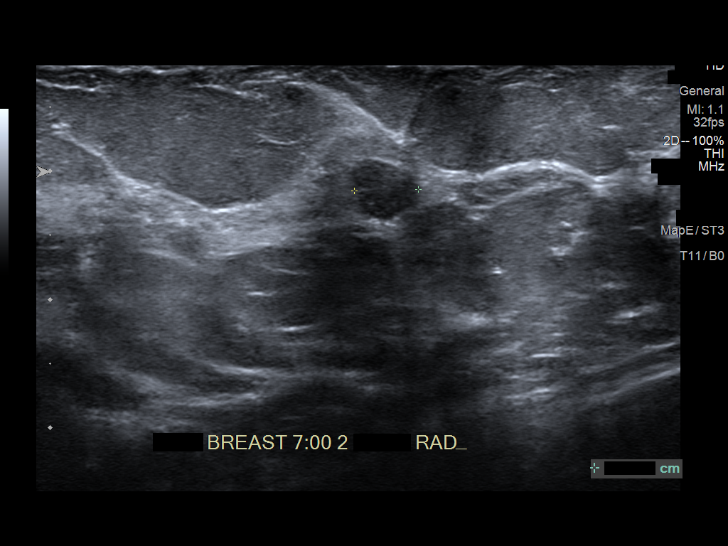
[im 7/13]
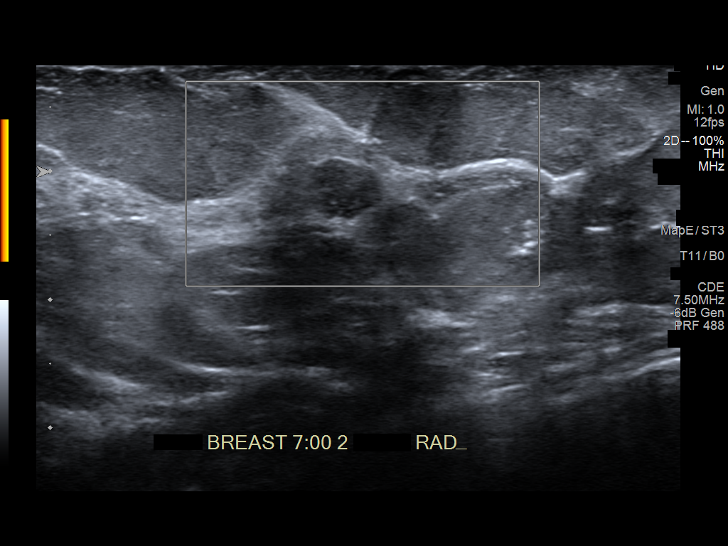
[im 8/13]
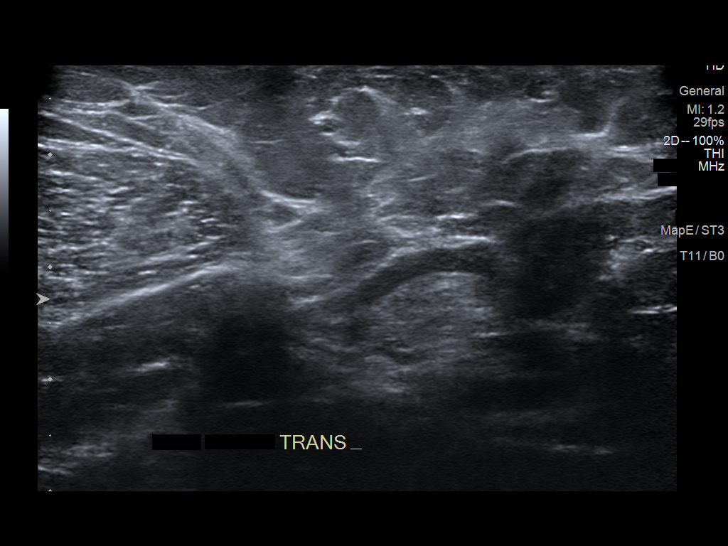
[im 9/13]
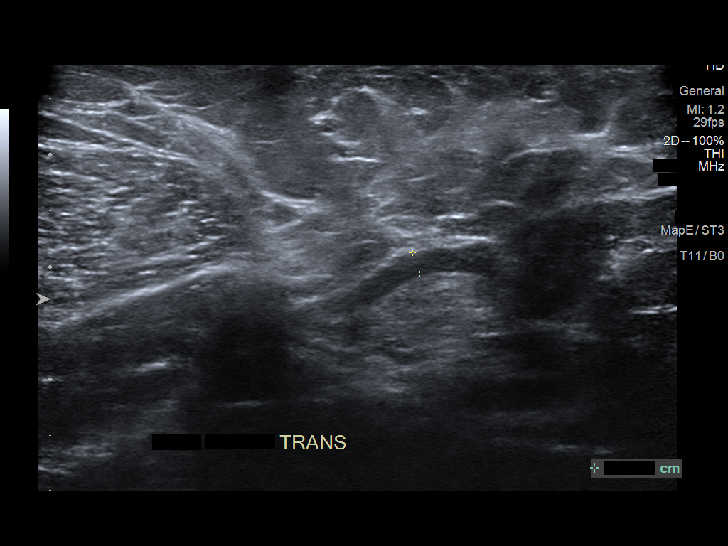
[im 10/13]
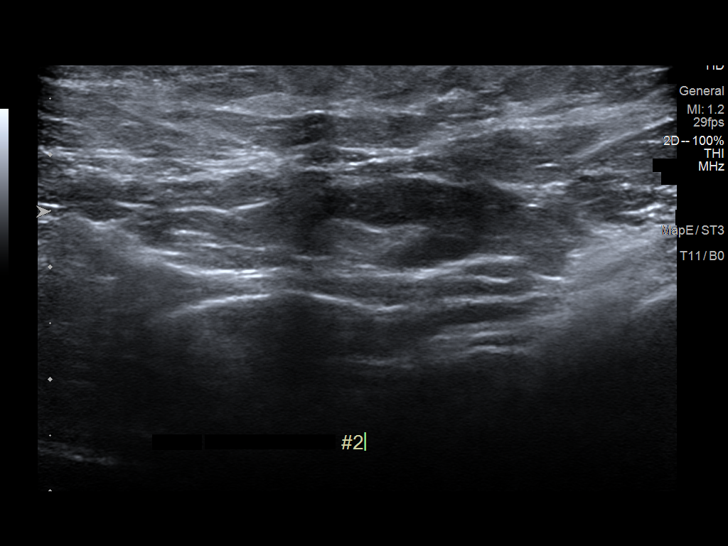
[im 11/13]
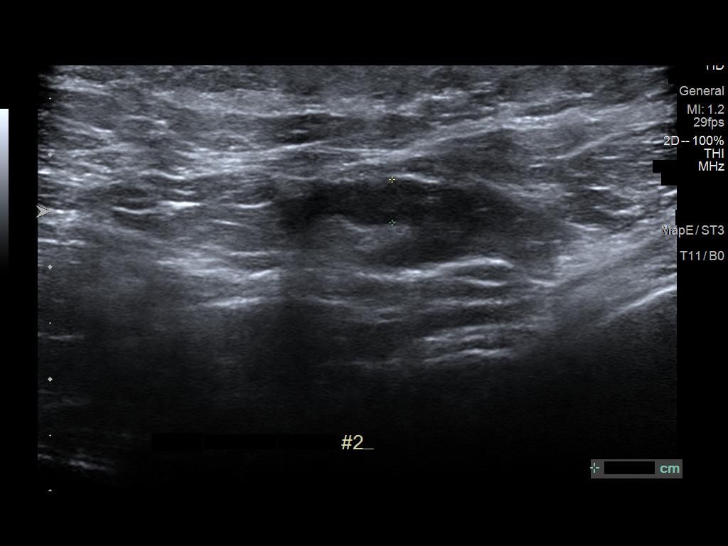
[im 12/13]
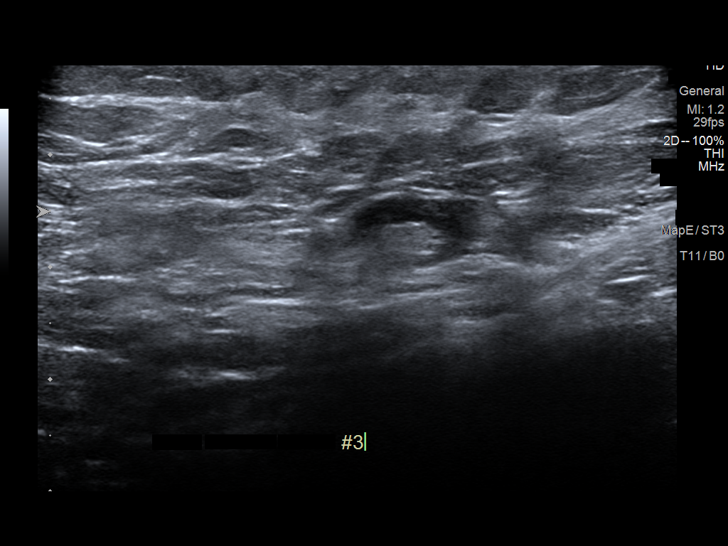
[im 13/13]
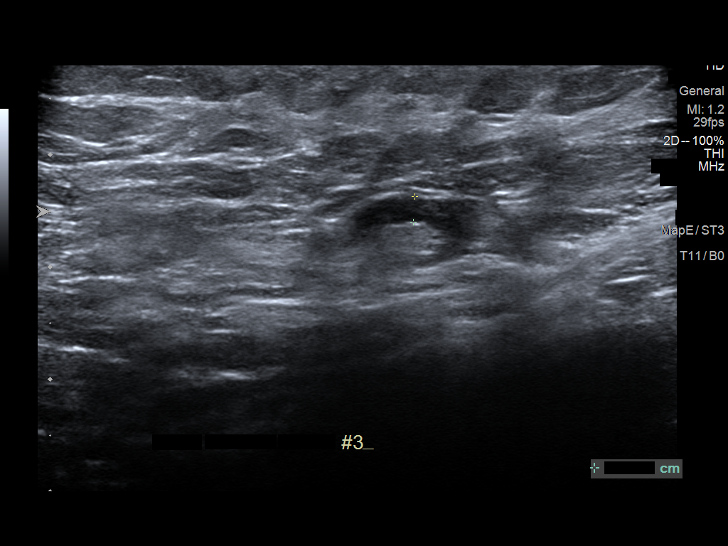

[13 of 13 positions shown; findings below may reference images not displayed]

Baseline.

ACR Breast Density Category c: The breast tissue is heterogeneously
dense, which may obscure small masses.
FINDINGS: There is an oval mass with some circumscribed and some indistinct or
obscured margins in the lower inner quadrant of the left breast,
middle depth. No findings elsewhere in either breast suspicious for
malignancy.

On physical exam, no mass is palpable in the lower inner left breast
or left axilla.

Targeted ultrasound is performed, showing a 9 x 5 x 3 mm oval,
horizontally oriented, hypoechoic mass in the 7 o'clock position of
the left breast, 2 cm from the nipple. This has predominantly
circumscribed margins with some indistinct and mildly irregular
margins.

Ultrasound of the left axilla demonstrated a single left axillary
lymph node with mild eccentric cortical thickening measuring 3.9 mm
in maximum thickness.
IMPRESSION: 1. 9 mm indeterminate mass in the 7 o'clock position of the left
breast.
2. Single mildly abnormal left axillary lymph node. This could
represent a reactive or metastatic node.

RECOMMENDATION:
Ultrasound-guided core needle biopsy of the 9 mm mass in the 7
o'clock position of the left breast and ultrasound-guided core
needle biopsy of mildly abnormal left axillary lymph node. This has
been discussed with the patient and the biopsies been scheduled [DATE]
a.m. on 08/28/2020.

I have discussed the findings and recommendations with the patient.
If applicable, a reminder letter will be sent to the patient
regarding the next appointment.

BI-RADS CATEGORY  4: Suspicious.

## 2022-08-31 IMAGING — US US BREAST BX W LOC DEV 1ST LESION IMG BX SPEC US GUIDE*L*
1 series · 12 of 17 positions shown · non-contrast
Comparison: Previous exam(s).
COMPARISON: Previous exam(s).

Addendum:
CLINICAL DATA: 40-year-old female presenting for ultrasound-guided
biopsy of a left breast mass and a left axillary lymph node.

EXAM:
ULTRASOUND GUIDED LEFT BREAST CORE NEEDLE BIOPSY

[Series 1: us breast bx w loc dev 1st lesion img bx spec us g · 0.06mm/px · 12 of 17 slices shown]
[im 1/17]
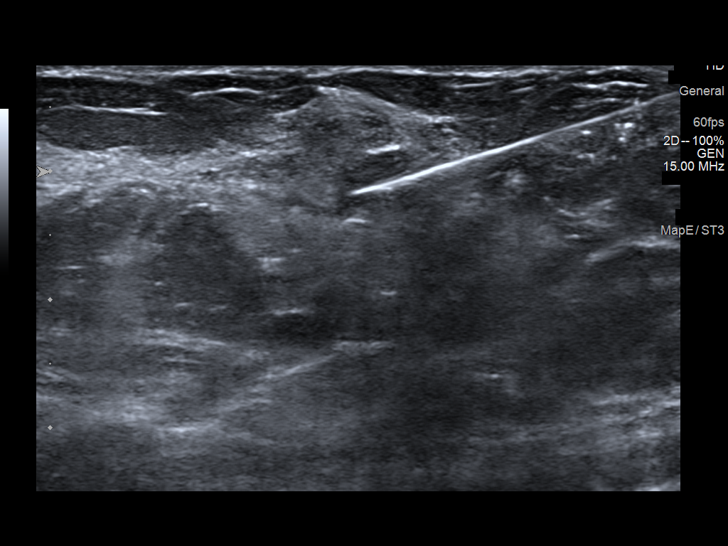
[im 3/17]
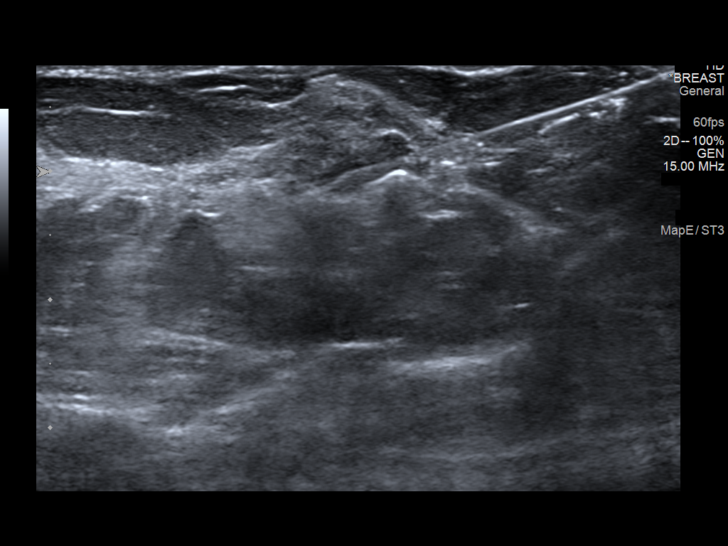
[im 4/17]
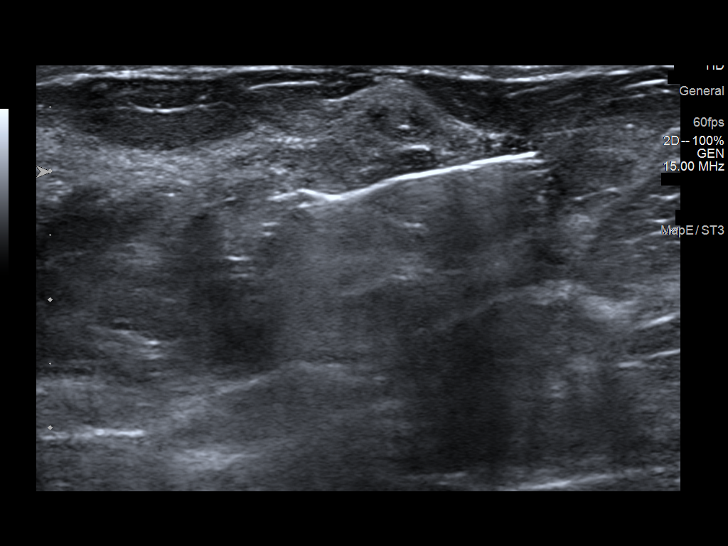
[im 5/17]
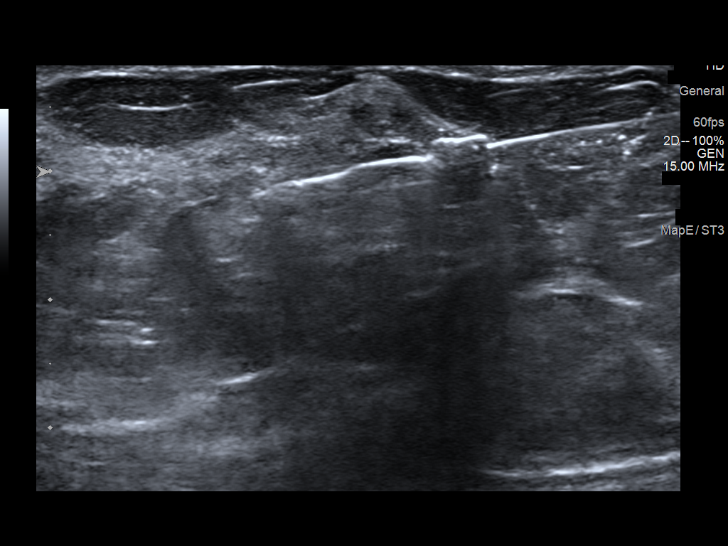
[im 7/17]
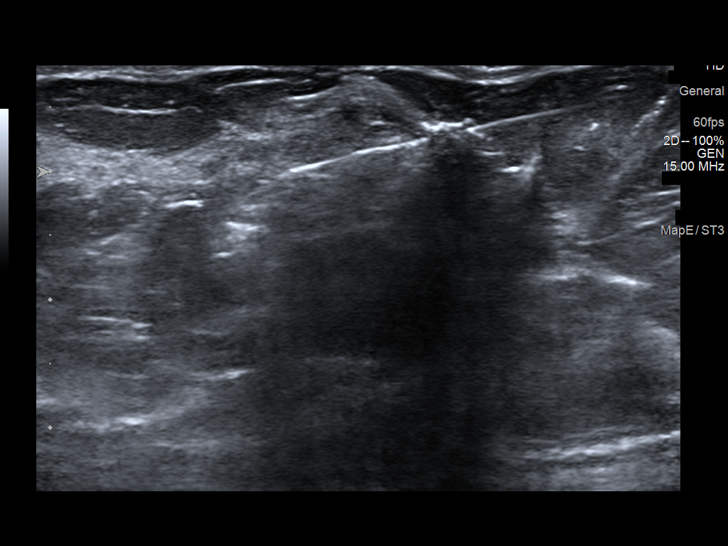
[im 8/17]
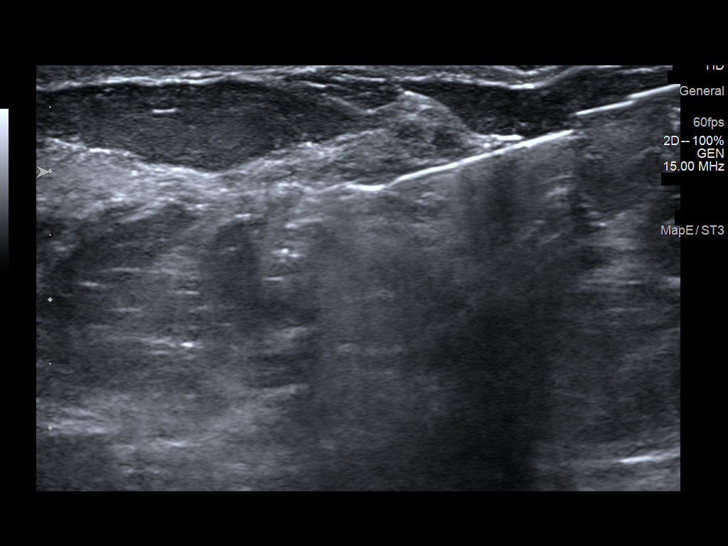
[im 10/17]
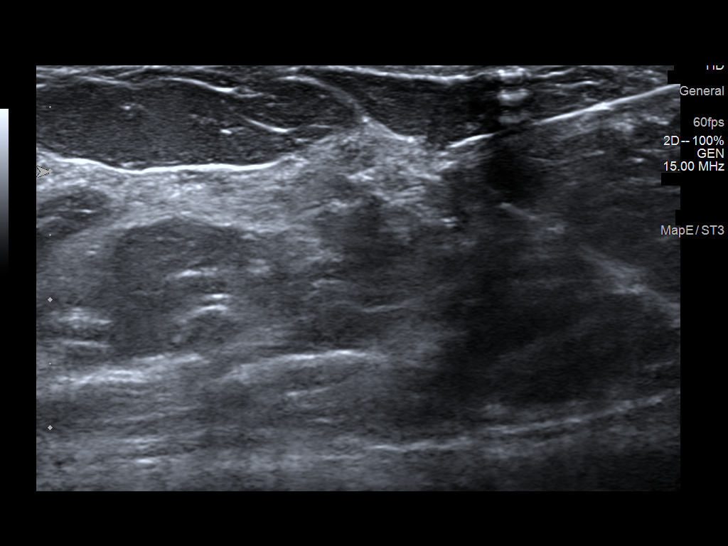
[im 11/17]
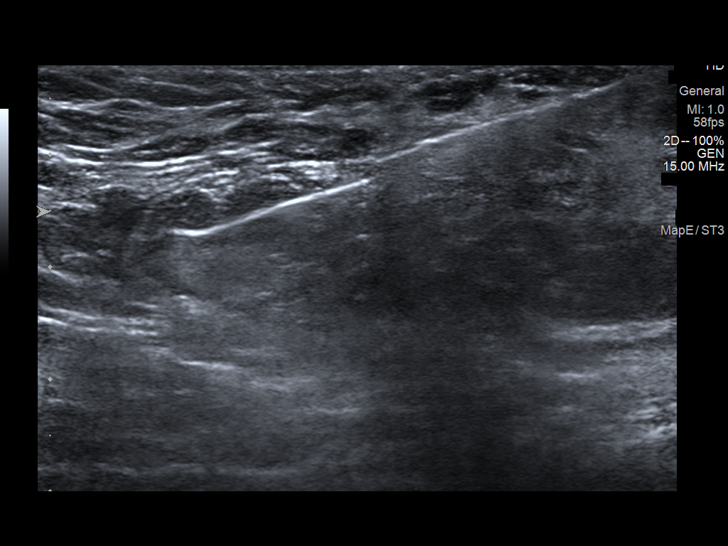
[im 13/17]
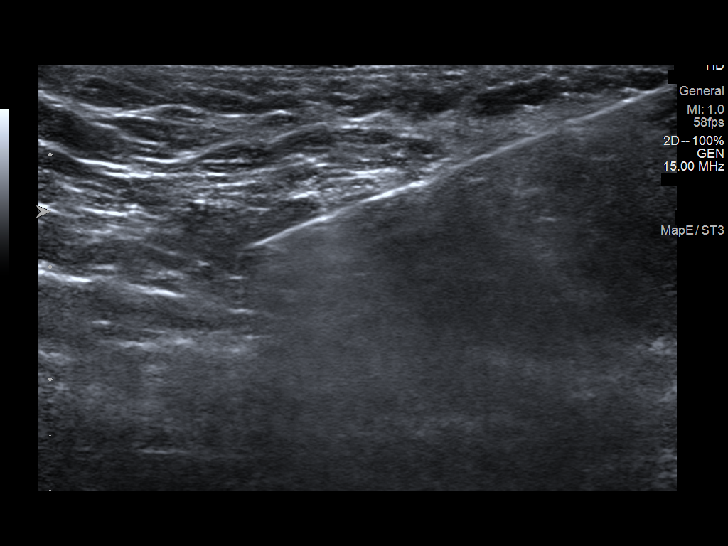
[im 14/17]
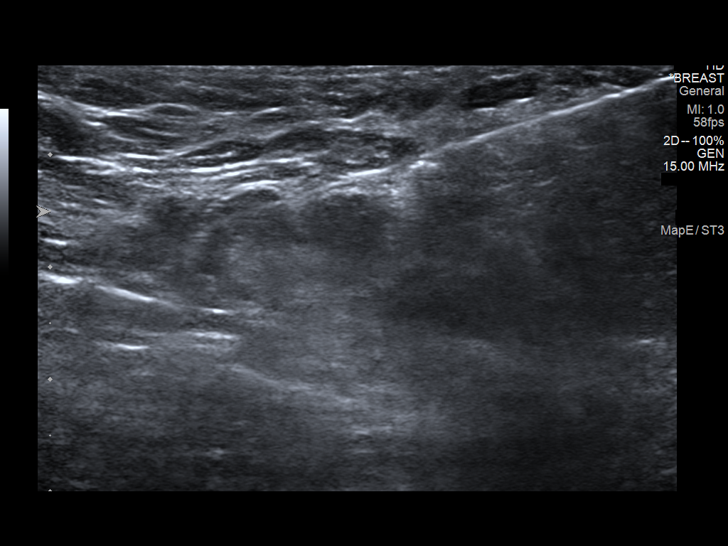
[im 15/17]
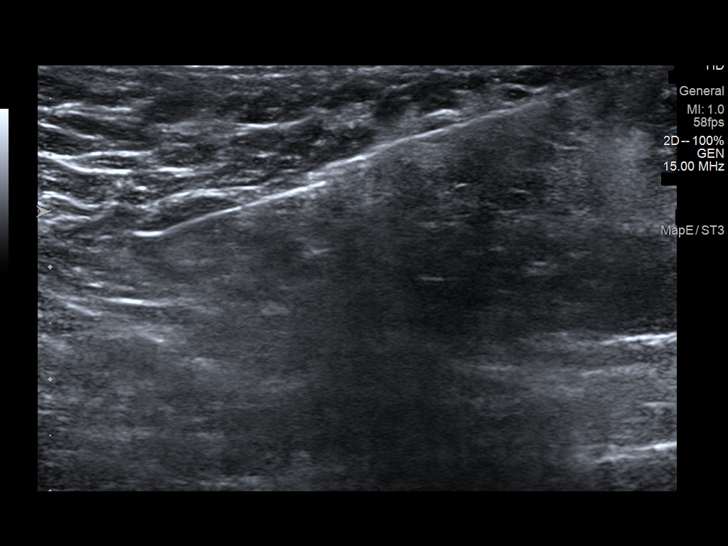
[im 17/17]
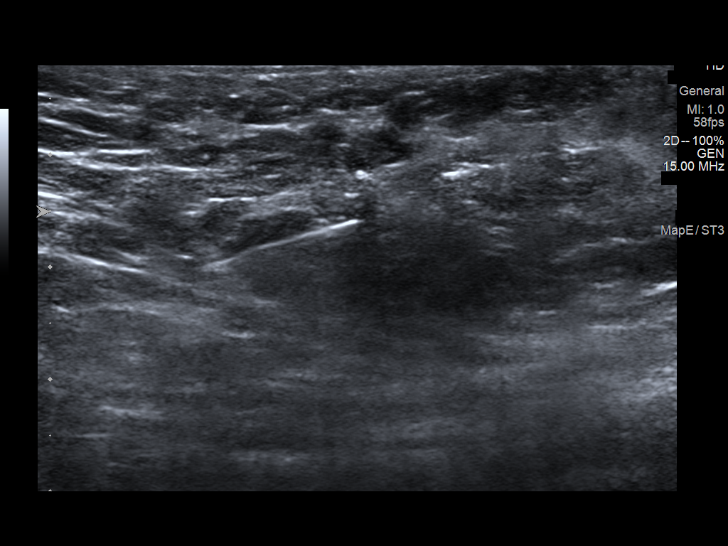

[12 of 17 positions shown; findings below may reference images not displayed]



#1 Lesion quadrant: Lower inner quadrant

Using sterile technique and 1% Lidocaine as local anesthetic, under
direct ultrasound visualization, a 14 gauge Bengt-Inge device was
used to perform biopsy of a mass in the left breast at 7 o'clock, 2
cm from the nipple using an inferior approach. At the conclusion of
the procedure a ribbon shaped tissue marker clip was deployed into
the biopsy cavity.

--------------------------------------------------------------------------------------------------------------------------------------------

#2 Lesion quadrant: Left axilla

Using sterile technique and 1% Lidocaine as local anesthetic, under
direct ultrasound visualization, a 14 gauge Bengt-Inge device was
used to perform biopsy of a left axillary lymph node using an
inferior approach. At the conclusion of the procedure a try
Daisy tissue marker clip was deployed into the biopsy cavity.

Follow up 2 view mammogram was performed and dictated separately.
IMPRESSION: 1. Ultrasound guided biopsy of a left breast mass at 7 o'clock. No
apparent complications.

2. Ultrasound guided biopsy of a left axillary lymph node. No
apparent complications.

ADDENDUM:
Pathology revealed DUCTAL ADENOMA WITH CALCIFICATIONS- NO EVIDENCE
OF MALIGNANCY of the LEFT breast, 7 o'clock, 5cmfn. This was found
to be concordant by Dr. Wan Bees Depurple.

Pathology revealed REACTIVE LYMPH NODE- NO METASTATIC CARCINOMA
IDENTIFIED of the LEFT axilla. This was found to be concordant by
Dr. Wan Bees Depurple.

Pathology results were discussed with the patient by telephone. The
patient reported doing well after the biopsies with tenderness at
the sites. Post biopsy instructions and care were reviewed and
questions were answered. The patient was encouraged to call The

The patient was instructed to return for annual screening
mammography and informed a reminder notice would be sent regarding
this appointment.

Pathology results reported by Mofidul Tiger RN on 08/29/2020.



#1 Lesion quadrant: Lower inner quadrant

Using sterile technique and 1% Lidocaine as local anesthetic, under
direct ultrasound visualization, a 14 gauge Bengt-Inge device was
used to perform biopsy of a mass in the left breast at 7 o'clock, 2
cm from the nipple using an inferior approach. At the conclusion of
the procedure a ribbon shaped tissue marker clip was deployed into
the biopsy cavity.

--------------------------------------------------------------------------------------------------------------------------------------------

#2 Lesion quadrant: Left axilla

Using sterile technique and 1% Lidocaine as local anesthetic, under
direct ultrasound visualization, a 14 gauge Bengt-Inge device was
used to perform biopsy of a left axillary lymph node using an
inferior approach. At the conclusion of the procedure a try
Daisy tissue marker clip was deployed into the biopsy cavity.

Follow up 2 view mammogram was performed and dictated separately.
IMPRESSION: 1. Ultrasound guided biopsy of a left breast mass at 7 o'clock. No
apparent complications.

2. Ultrasound guided biopsy of a left axillary lymph node. No
apparent complications.

## 2023-06-29 ENCOUNTER — Ambulatory Visit (INDEPENDENT_AMBULATORY_CARE_PROVIDER_SITE_OTHER)

## 2023-06-29 ENCOUNTER — Ambulatory Visit (HOSPITAL_COMMUNITY): Admission: EM | Admit: 2023-06-29 | Discharge: 2023-06-29 | Disposition: A | Discharge status: Home / Self Care

## 2023-06-29 ENCOUNTER — Encounter (HOSPITAL_COMMUNITY): Payer: Self-pay

## 2023-06-29 DIAGNOSIS — M5442 Lumbago with sciatica, left side: Secondary | ICD-10-CM

## 2023-06-29 MED ORDER — DICLOFENAC SODIUM 75 MG PO TBEC
75.0000 mg | DELAYED_RELEASE_TABLET | Freq: Two times a day (BID) | ORAL | 0 refills | Status: AC
Start: 2023-06-29 — End: ?

## 2023-06-29 MED ORDER — BACLOFEN 20 MG PO TABS
20.0000 mg | ORAL_TABLET | Freq: Three times a day (TID) | ORAL | 0 refills | Status: AC
Start: 2023-06-29 — End: ?

## 2023-06-29 NOTE — ED Provider Notes (Signed)
 [facility]  Note:  This document was prepared using Dragon voice recognition software and may include unintentional dictation errors.  MRN: [id number] DOB: 04-23-1980  Subjective:   Lori Kelley is a 43 y.o. female presenting for midline lower back pain with radiation to the left leg x 1 week.  Patient denies any known injury or trauma.  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  Patient has been using pain patches with some relief.  Patient denies ever having imaging of her lower back or any lumbar surgical procedures.  Patient has some relief with sitting and with walking however if she is standing or the active getting up from seated exacerbates pain.  No current facility-administered medications for this encounter.  Current Outpatient Medications:    baclofen (LIORESAL) 20 MG tablet, Take 1 tablet (20 mg total) by mouth 3 (three) times daily., Disp: 30 each, Rfl: 0   diclofenac (VOLTAREN) 75 MG EC tablet, Take 1 tablet (75 mg total) by mouth 2 (two) times daily., Disp: 30 tablet, Rfl: 0   buprenorphine-naloxone (SUBOXONE) 8-2 mg SUBL SL tablet, Place 2 tablets under the tongue daily., Disp: , Rfl:    hydrochlorothiazide (HYDRODIURIL) 25 MG tablet, Take 25 mg by mouth every morning., Disp: , Rfl: 9   Multiple Vitamin (MULTIVITAMIN WITH MINERALS) TABS tablet, Take 1 tablet by mouth every morning., Disp: , Rfl:    No Known Allergies  Past Medical History:  Diagnosis Date   Herpes    Hypertension      Past Surgical History:  Procedure Laterality Date   TOOTH EXTRACTION  12-2010    Family History  Problem Relation Age of Onset   Heart disease Mother    Vision loss Father    Breast cancer Sister    Anesthesia problems Neg Hx    Hypotension Neg Hx    Malignant hyperthermia Neg Hx    Pseudochol deficiency Neg Hx    Other Neg Hx     Social History    Tobacco Use   Smoking status: Former    Current packs/day: 0.00    Average packs/day: 0.5 packs/day for 7.0 years (3.5 ttl pk-yrs)    Types: Cigarettes    Start date: 02/03/2004    Quit date: 02/03/2011    Years since quitting: 12.4   Smokeless tobacco: Never  Vaping Use   Vaping status: Never Used  Substance Use Topics   Alcohol use: No   Drug use: No    ROS Refer to HPI for ROS details.  Objective:   Vitals: BP (!) 161/97 (BP Location: Left Arm)   Pulse 72   Temp 98.4 F (36.9 C) (Oral)   Resp 18   SpO2 99%   Physical Exam Vitals and nursing note reviewed.  Constitutional:      General: She is not in acute distress.    Appearance: Normal appearance. She is well-developed. She is not ill-appearing or toxic-appearing.  HENT:     Head: Normocephalic.  Cardiovascular:     Rate and Rhythm: Normal rate.  Pulmonary:     Effort: Pulmonary effort is normal. No respiratory distress.  Musculoskeletal:     Cervical back: Neck supple.     Lumbar back: Spasms, tenderness and bony tenderness present. No swelling. Decreased range of motion.  Skin:    General: Skin is warm and dry.     Capillary Refill:  Capillary refill takes less than 2 seconds.  Neurological:     General: No focal deficit present.     Mental Status: She is alert and oriented to person, place, and time.  Psychiatric:        Mood and Affect: Mood normal.        Behavior: Behavior normal.     Procedures  No results found for this or any previous visit (from the past 24 hours).  Assessment and Plan :   PDMP not reviewed this encounter.  1. Acute midline low back pain with left-sided sciatica    1. Acute midline low back pain with left-sided sciatica (Primary) - DG Lumbar Spine Complete x-ray shows no acute spinal abnormality or fracture.  Disc heights appear moderate in the normal, mild degenerative changes. - diclofenac (VOLTAREN) 75 MG EC tablet; Take 1 tablet (75 mg total) by mouth 2 (two)  times daily.  Dispense: 30 tablet; Refill: 0 - baclofen (LIORESAL) 20 MG tablet; Take 1 tablet (20 mg total) by mouth 3 (three) times daily.  Dispense: 30 each; Refill: 0 -Take medications as directed and continue to monitor symptoms if there is any escalation of current symptoms or development of new symptoms follow-up for further evaluation and management.  Lucky Cowboy   Cook, [place], [place] 06/29/23 1513

## 2023-06-29 NOTE — Discharge Instructions (Addendum)
 1. Acute midline low back pain with left-sided sciatica (Primary) - DG Lumbar Spine Complete x-ray shows no acute spinal abnormality or fracture.  Disc heights appear moderate in the normal, mild degenerative changes. - diclofenac (VOLTAREN) 75 MG EC tablet; Take 1 tablet (75 mg total) by mouth 2 (two) times daily.  Dispense: 30 tablet; Refill: 0 - baclofen (LIORESAL) 20 MG tablet; Take 1 tablet (20 mg total) by mouth 3 (three) times daily.  Dispense: 30 each; Refill: 0 -Take medications as directed and continue to monitor symptoms if there is any escalation of current symptoms or development of new symptoms follow-up for further evaluation and management.

## 2023-06-29 NOTE — ED Triage Notes (Signed)
 Pt c/o lower back pain radiating to lt leg x1wk. Denies injury. States using pain patches with relief.

## 2023-08-18 ENCOUNTER — Encounter (HOSPITAL_COMMUNITY): Payer: Self-pay

## 2023-08-18 ENCOUNTER — Other Ambulatory Visit: Payer: Self-pay

## 2023-08-18 ENCOUNTER — Emergency Department (HOSPITAL_COMMUNITY)
Admission: EM | Admit: 2023-08-18 | Discharge: 2023-08-18 | Disposition: A | Discharge status: Home / Self Care | Attending: Emergency Medicine | Admitting: Emergency Medicine

## 2023-08-18 DIAGNOSIS — Z79899 Other long term (current) drug therapy: Secondary | ICD-10-CM | POA: Diagnosis not present

## 2023-08-18 DIAGNOSIS — J45909 Unspecified asthma, uncomplicated: Secondary | ICD-10-CM | POA: Insufficient documentation

## 2023-08-18 DIAGNOSIS — T7840XA Allergy, unspecified, initial encounter: Secondary | ICD-10-CM | POA: Diagnosis present

## 2023-08-18 DIAGNOSIS — Z7951 Long term (current) use of inhaled steroids: Secondary | ICD-10-CM | POA: Insufficient documentation

## 2023-08-18 MED ORDER — FAMOTIDINE IN NACL 20-0.9 MG/50ML-% IV SOLN
20.0000 mg | Freq: Once | INTRAVENOUS | Status: AC
  Administered 2023-08-18: 20 mg via INTRAVENOUS
  Filled 2023-08-18: qty 50

## 2023-08-18 MED ORDER — IPRATROPIUM-ALBUTEROL 0.5-2.5 (3) MG/3ML IN SOLN
3.0000 mL | Freq: Once | RESPIRATORY_TRACT | Status: AC
  Administered 2023-08-18: 3 mL via RESPIRATORY_TRACT
  Filled 2023-08-18: qty 3

## 2023-08-18 MED ORDER — PREDNISONE 10 MG PO TABS: 40.0000 mg | ORAL_TABLET | Freq: Every day | ORAL | 0 refills | Status: AC

## 2023-08-18 MED ORDER — EPINEPHRINE 0.3 MG/0.3ML IJ SOAJ: 0.3000 mg | INTRAMUSCULAR | 0 refills | Status: AC | PRN

## 2023-08-18 NOTE — ED Triage Notes (Signed)
 Patient BIB EMS c/o allergic reaction. Patient states that they had granola which had almonds in it. Patient has not had the food before and thus this is the first reaction.

## 2023-08-18 NOTE — Discharge Instructions (Signed)
 Return for any problem.  ?

## 2023-08-18 NOTE — ED Provider Notes (Signed)
 [facility] Provider Note   CSN: [id number] Arrival date & time: 08/18/23  1018     History {Add pertinent medical, surgical, social history, OB history to HPI:1} Chief Complaint  Patient presents with   Allergic Reaction    Lori Kelley is a 43 y.o. female.  43 year old female with prior medical history as detailed below presents for evaluation.  Patient reports that she was eating a granola (contained nuts).  Within several minutes of ingesting the knots she developed a scratchy tight throat.  She called EMS.  EMS has administered epinephrine , Solu-Medrol, Benadryl .  Patient feels improved on arrival.  She denies rash.  She denies shortness of breath.  She denies pain.  She denies prior history of significant allergic reaction in the past.  She has a history of mild asthma.  The history is provided by the patient.       Home Medications Prior to Admission medications   Medication Sig Start Date End Date Taking? Authorizing Provider  baclofen  (LIORESAL ) 20 MG tablet Take 1 tablet (20 mg total) by mouth 3 (three) times daily. 06/29/23   Reddick, Johnathan B, NP  Buprenorphine HCl-Naloxone HCl 8-2 MG FILM Place 1 Film under the tongue 2 (two) times daily. 08/10/23   [provider]  diclofenac  (VOLTAREN ) 75 MG EC tablet Take 1 tablet (75 mg total) by mouth 2 (two) times daily. 06/29/23   Reddick, Johnathan B, NP  hydrochlorothiazide (HYDRODIURIL) 25 MG tablet Take 25 mg by mouth every morning. 05/12/15   [provider]  Multiple Vitamin (MULTIVITAMIN WITH MINERALS) TABS tablet Take 1 tablet by mouth every morning.    [provider]      Allergies    Patient has no known allergies.    Review of Systems   Review of Systems  All other systems reviewed and are negative.   Physical Exam Updated Vital Signs BP 139/85 (BP Location: Left Arm)   Pulse 79   Temp 97.6 F (36.4 C) (Oral)   Resp 18   Ht 5'  6" (1.676 m)   Wt 82.6 kg   SpO2 100%   BMI 29.38 kg/m  Physical Exam Vitals and nursing note reviewed.  Constitutional:      General: She is not in acute distress.    Appearance: Normal appearance. She is well-developed.  HENT:     Head: Normocephalic and atraumatic.  Eyes:     Conjunctiva/sclera: Conjunctivae normal.     Pupils: Pupils are equal, round, and reactive to light.  Cardiovascular:     Rate and Rhythm: Normal rate and regular rhythm.     Heart sounds: Normal heart sounds.  Pulmonary:     Effort: Pulmonary effort is normal. No respiratory distress.     Breath sounds: Normal breath sounds.  Abdominal:     General: There is no distension.     Palpations: Abdomen is soft.     Tenderness: There is no abdominal tenderness.  Musculoskeletal:        General: No deformity. Normal range of motion.     Cervical back: Normal range of motion and neck supple.  Skin:    General: Skin is warm and dry.  Neurological:     General: No focal deficit present.     Mental Status: She is alert and oriented to person, place, and time.     ED Results / Procedures / Treatments   Labs (all labs ordered are listed, but only  abnormal results are displayed) Labs Reviewed - No data to display  EKG None  Radiology No results found.  Procedures Procedures  {Document cardiac monitor, telemetry assessment procedure when appropriate:1}  Medications Ordered in ED Medications  ipratropium-albuterol (DUONEB) 0.5-2.5 (3) MG/3ML nebulizer solution 3 mL (has no administration in time range)    ED Course/ Medical Decision Making/ A&P   {   Click here for ABCD2, HEART and other calculatorsREFRESH Note before signing :1}                              Medical Decision Making Risk Prescription drug management.    Medical Screen Complete  This patient presented to the ED with complaint of allergic rxn.  This complaint involves an extensive number of treatment options. The initial  differential diagnosis includes, but is not limited to, allergic rxn  This presentation is: Acute, Self-Limited, Previously Undiagnosed, Uncertain Prognosis, Complicated, Systemic Symptoms, and Threat to Life/Bodily Function  Patient presents with allergic reaction symptoms.  These are likely related to the ingestion of a food product containing nuts.  Patient without prior history of similar reaction.  EMS administered epinephrine , Solu-Medrol, Benadryl  prior to arrival.      Co morbidities that complicated the patient's evaluation  ***   Additional history obtained:  Additional history obtained from {History source:19196::"EMS","Spouse","Family","Friend","Caregiver"} External records from outside sources obtained and reviewed including prior ED visits and prior Inpatient records.    Lab Tests:  I ordered and personally interpreted labs.  The pertinent results include:  ***   Imaging Studies ordered:  I ordered imaging studies including ***  I independently visualized and interpreted obtained imaging which showed *** I agree with the radiologist interpretation.   Cardiac Monitoring:  The patient was maintained on a cardiac monitor.  I personally viewed and interpreted the cardiac monitor which showed an underlying rhythm of: ***   Medicines ordered:  I ordered medication including ***  for ***  Reevaluation of the patient after these medicines showed that the patient: {resolved/improved/worsened:23923::"improved"}    Test Considered:  ***   Critical Interventions:  ***   Consultations Obtained:  I consulted ***,  and discussed lab and imaging findings as well as pertinent plan of care.    Problem List / ED Course:  ***   Reevaluation:  After the interventions noted above, I reevaluated the patient and found that they have: {resolved/improved/worsened:23923::"improved"}   Social Determinants of Health:  ***   Disposition:  After  consideration of the diagnostic results and the patients response to treatment, I feel that the patent would benefit from ***.    {Document critical care time when appropriate:1} {Document review of labs and clinical decision tools ie heart score, Chads2Vasc2 etc:1}  {Document your independent review of radiology images, and any outside records:1} {Document your discussion with family members, caretakers, and with consultants:1} {Document social determinants of health affecting pt's care:1} {Document your decision making why or why not admission, treatments were needed:1} Final Clinical Impression(s) / ED Diagnoses Final diagnoses:  None    Rx / DC Orders ED Discharge Orders     None
